# Patient Record
Sex: Female | Born: 1955
Health system: Southern US, Community
[De-identification: ages and names within clinical notes are randomized; demographics above are authoritative.]

## PROBLEM LIST (undated history)

## (undated) DIAGNOSIS — E039 Hypothyroidism, unspecified: Secondary | ICD-10-CM

## (undated) HISTORY — PX: CHOLECYSTECTOMY: SHX55

---

## 2006-01-05 ENCOUNTER — Ambulatory Visit: Payer: Self-pay

## 2006-08-09 ENCOUNTER — Ambulatory Visit: Payer: Self-pay

## 2007-01-14 ENCOUNTER — Ambulatory Visit: Payer: Self-pay

## 2007-02-05 ENCOUNTER — Ambulatory Visit: Payer: Self-pay

## 2007-03-18 IMAGING — US ULTRASOUND LEFT BREAST
1 series · 9 of 9 positions shown · non-contrast
Comparison: none

REASON FOR EXAM: LEFT breast lump
COMMENTS:

[Series 1: ultrasound left breast · 9 of 9 slices shown]
[im 1/9]
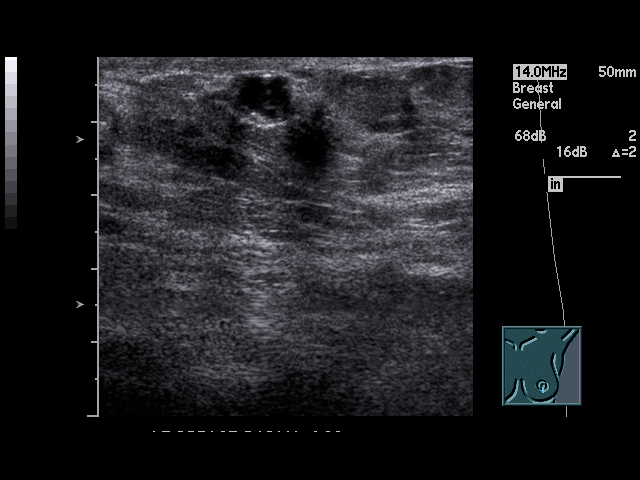
[im 2/9]
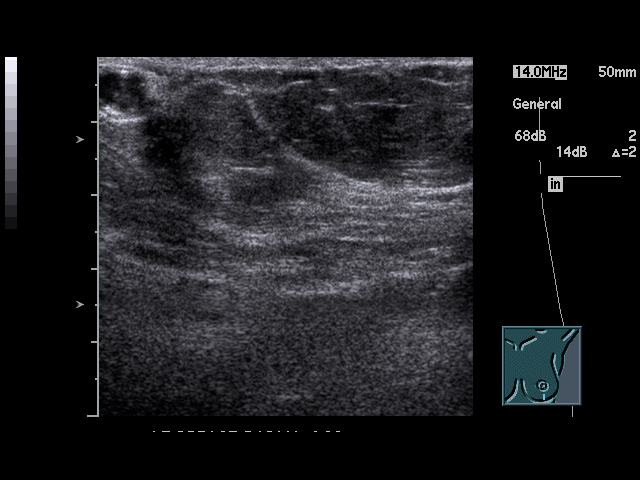
[im 3/9]
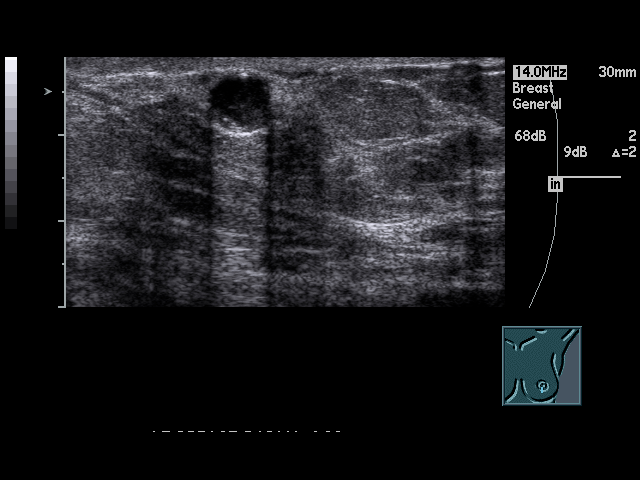
[im 4/9]
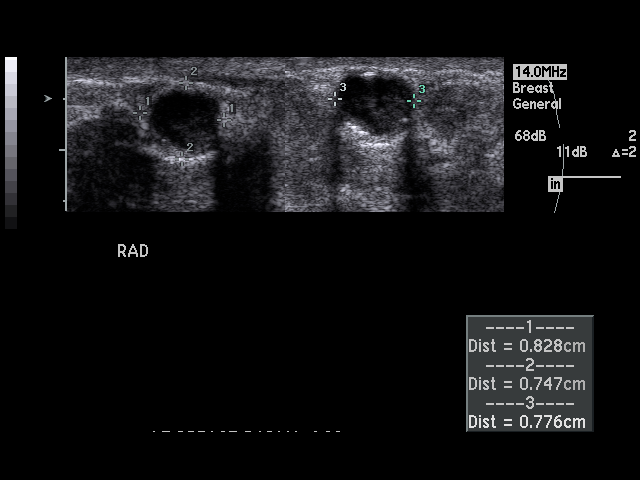
[im 5/9]
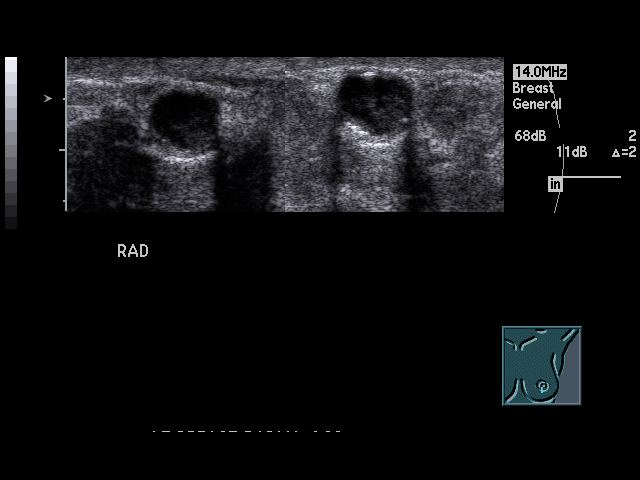
[im 6/9]
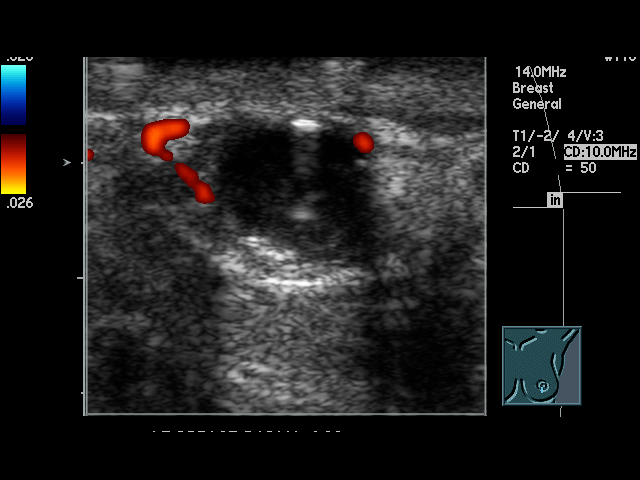
[im 7/9]
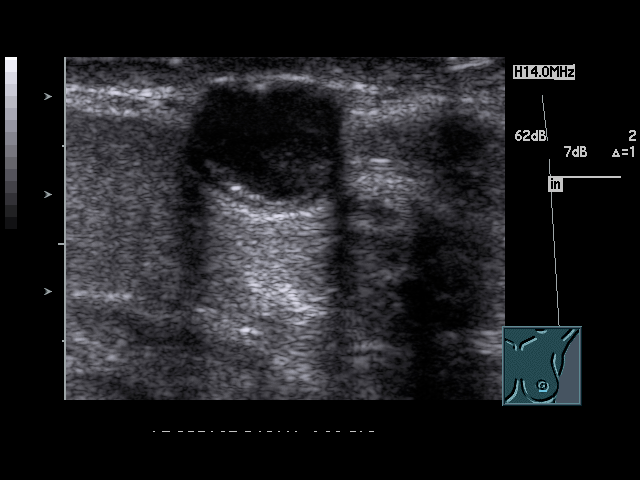
[im 8/9]
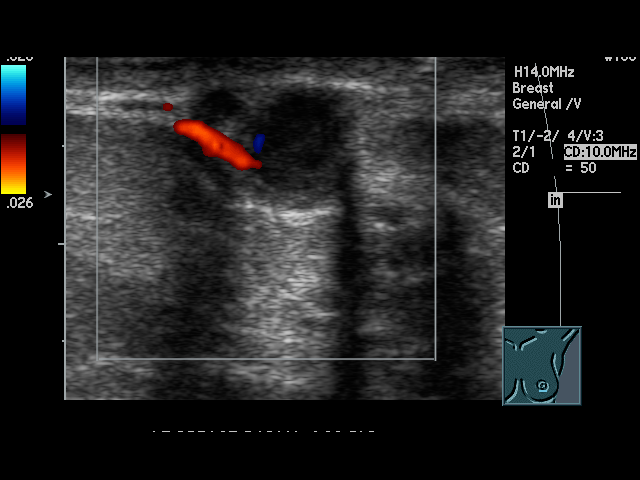
[im 9/9]
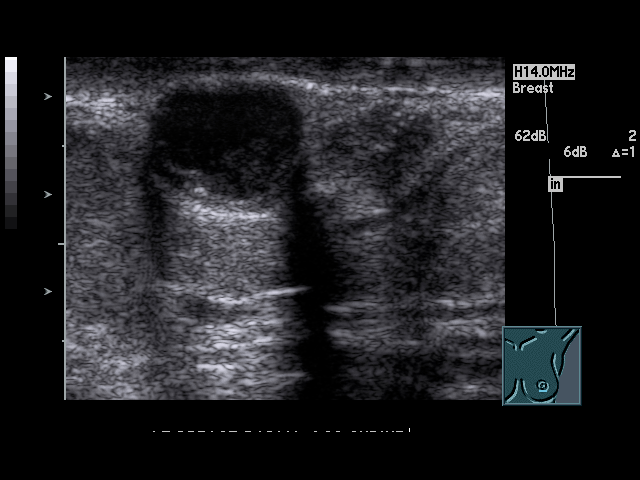

[9 of 9 positions shown; findings below may reference images not displayed]

PROCEDURE:     US  - US BREAST LEFT  - January 05, 2006 [DATE]

RESULT:     LEFT breast ultrasound targeted to the retroareolar area on the
LEFT shows a complex cystic mass adjacent to the skin and which contains
internal echoes.  In addition there appears to be some calcification
associated with the wall of this nodule.  The findings are not specific
sonographically and therefore surgical consultation for consideration of
aspiration or biopsy is recommended.  It is noted that this mass touches the
skin which would favor a benign process such as a sebaceous cyst.  This,
however, is not certain sonographically and again biopsy or aspiration is
recommended.
IMPRESSION: There is a complex cystic nodule measuring 8.28 mm in diameter in the
subareolar area on the LEFT.  The findings sonographically are indeterminate
and surgical consultation for consideration of aspiration or biopsy is
recommended.

BI-RADS:  Category 4 - Suspicious Abnormality.

## 2013-06-10 ENCOUNTER — Other Ambulatory Visit: Payer: Self-pay | Admitting: Internal Medicine

## 2013-06-10 DIAGNOSIS — R1310 Dysphagia, unspecified: Secondary | ICD-10-CM

## 2013-06-18 ENCOUNTER — Ambulatory Visit
Admission: RE | Admit: 2013-06-18 | Discharge: 2013-06-18 | Disposition: A | Discharge status: Home / Self Care | Payer: Medicaid Other | Source: Ambulatory Visit | Attending: Internal Medicine | Admitting: Internal Medicine

## 2013-06-18 ENCOUNTER — Other Ambulatory Visit: Payer: Self-pay | Admitting: Internal Medicine

## 2013-06-18 ENCOUNTER — Other Ambulatory Visit: Payer: Self-pay

## 2013-06-18 DIAGNOSIS — R1310 Dysphagia, unspecified: Secondary | ICD-10-CM

## 2013-07-22 ENCOUNTER — Ambulatory Visit: Payer: Self-pay | Admitting: Gastroenterology

## 2013-07-24 ENCOUNTER — Ambulatory Visit: Payer: Self-pay | Admitting: Physician Assistant

## 2013-07-28 LAB — PATHOLOGY REPORT

## 2013-09-08 ENCOUNTER — Ambulatory Visit: Payer: Self-pay | Admitting: Physician Assistant

## 2014-04-28 ENCOUNTER — Ambulatory Visit: Payer: Self-pay | Admitting: Physician Assistant

## 2014-10-04 IMAGING — CR RIGHT ANKLE - COMPLETE 3+ VIEW
1 series · 5 of 5 positions shown · non-contrast
Comparison: none

REASON FOR EXAM: pain
COMMENTS:

PROCEDURE:     KDR - KDXR ANKLE RIGHT COMPLETE  - July 24, 2013 [DATE]
RESULT:     Right ankle images demonstrate no fracture, dislocation or
radiopaque foreign body.

[Series 1: ap · 0.17mm/px · 5 of 5 slices shown]
[im 1/5]
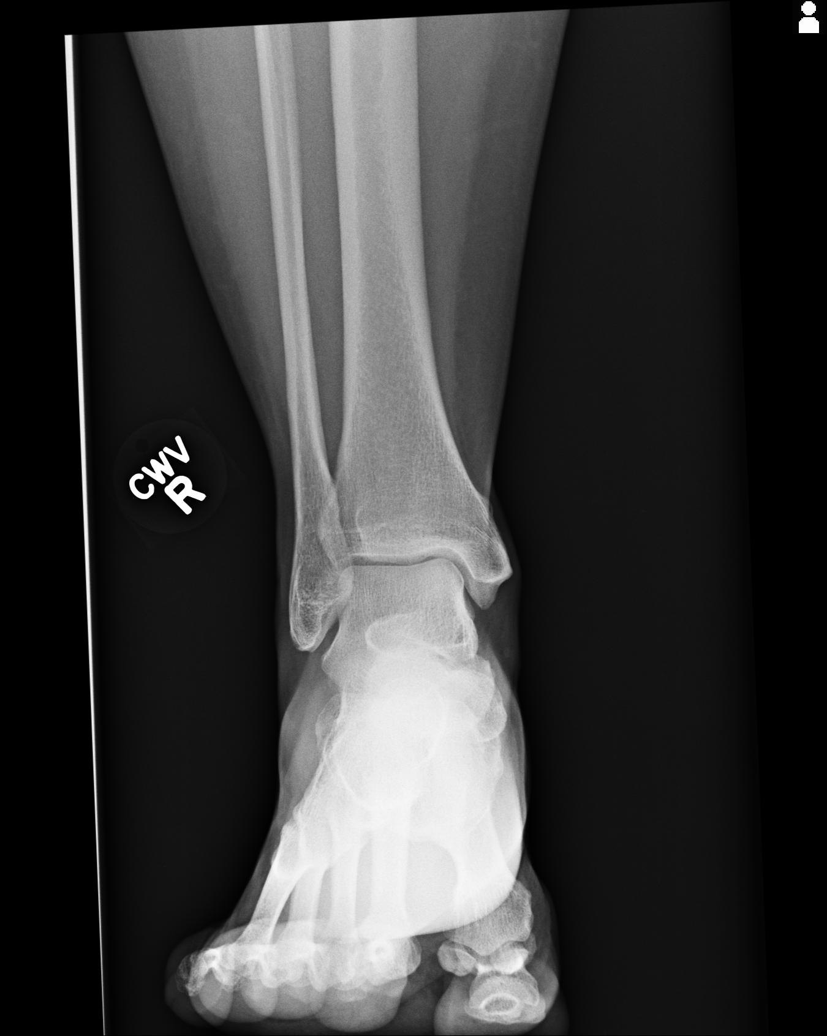
[im 2/5]
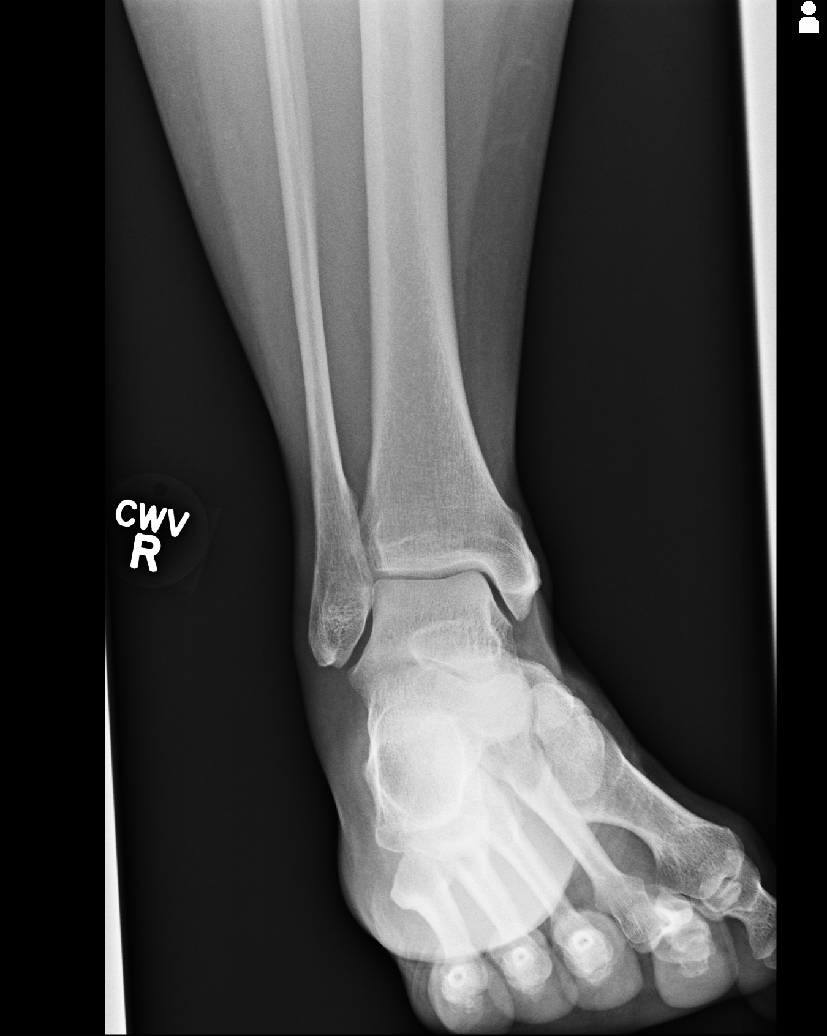
[im 3/5]
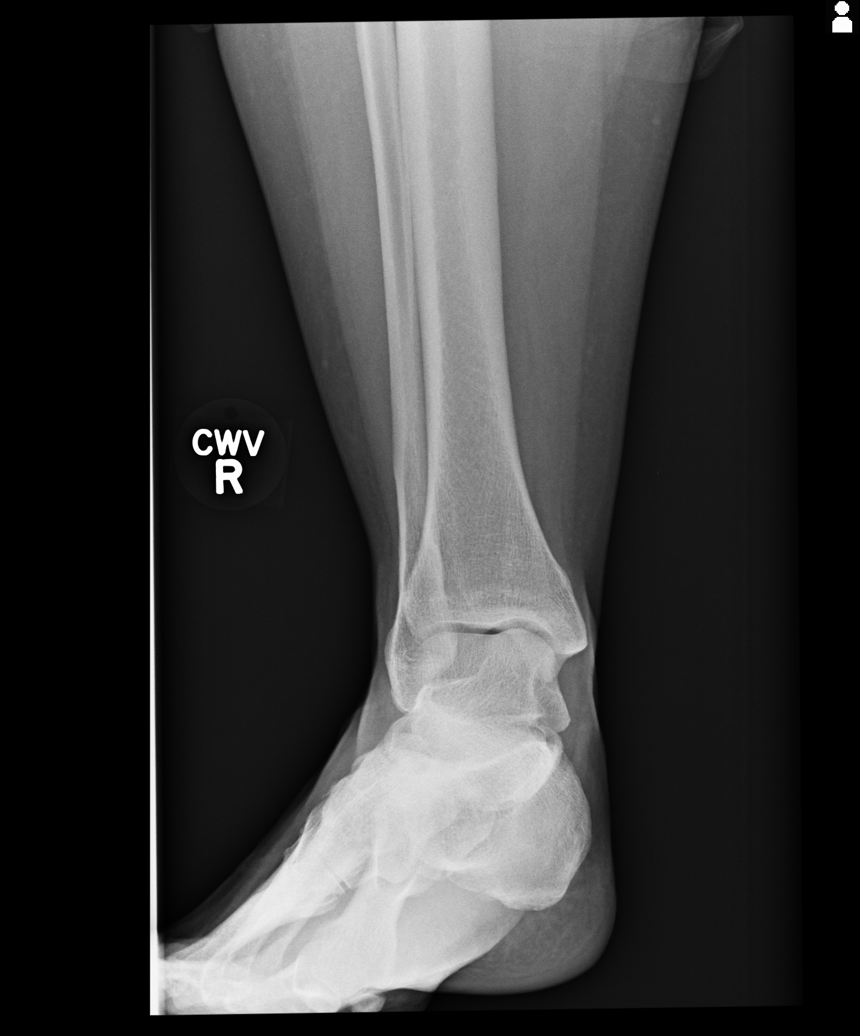
[im 4/5]
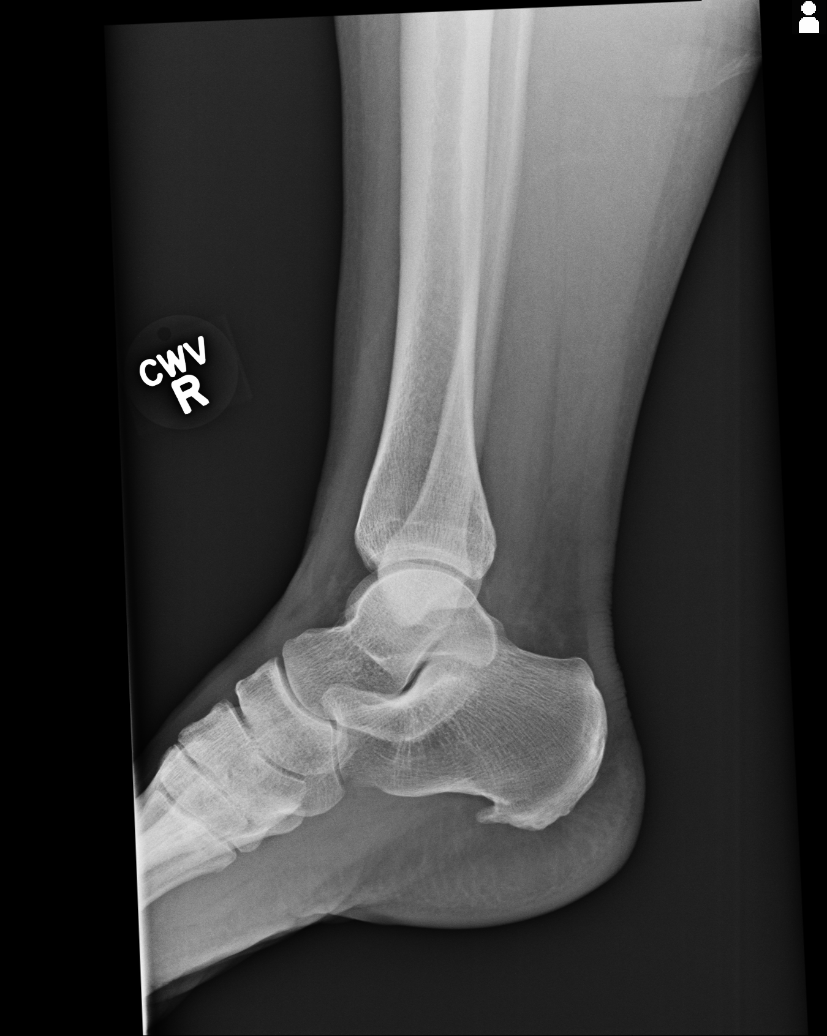
[im 5/5]
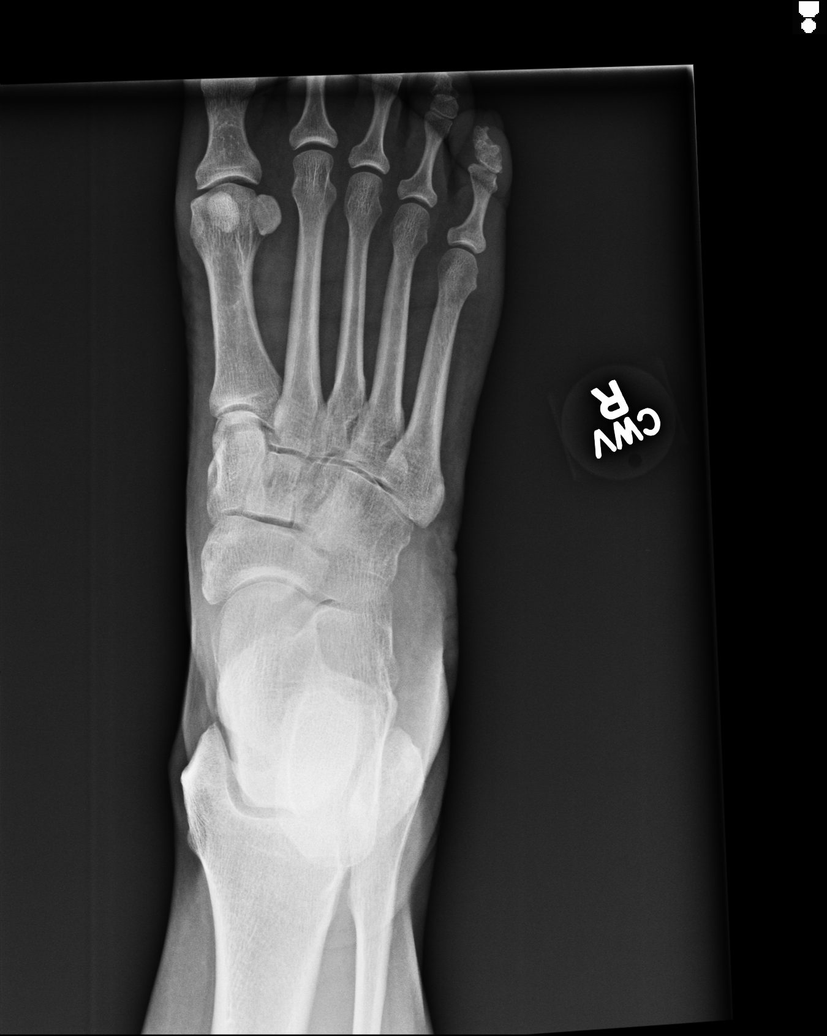

[5 of 5 positions shown; findings below may reference images not displayed]

IMPRESSION: Please see above.

[REDACTED]

## 2014-11-04 ENCOUNTER — Ambulatory Visit: Payer: Self-pay | Admitting: Physician Assistant

## 2015-01-01 ENCOUNTER — Ambulatory Visit: Payer: Self-pay | Admitting: Physician Assistant

## 2015-09-07 ENCOUNTER — Other Ambulatory Visit: Payer: Self-pay | Admitting: Physician Assistant

## 2015-09-07 DIAGNOSIS — R079 Chest pain, unspecified: Secondary | ICD-10-CM

## 2015-09-14 ENCOUNTER — Encounter
Admission: RE | Admit: 2015-09-14 | Discharge: 2015-09-14 | Disposition: A | Discharge status: Home / Self Care | Payer: Medicaid Other | Source: Ambulatory Visit | Attending: Physician Assistant | Admitting: Physician Assistant

## 2015-09-14 DIAGNOSIS — R079 Chest pain, unspecified: Secondary | ICD-10-CM | POA: Diagnosis present

## 2015-09-14 HISTORY — DX: Hypothyroidism, unspecified: E03.9

## 2015-09-14 LAB — NM MYOCAR MULTI W/SPECT W/WALL MOTION / EF
CHL CUP NUCLEAR SDS: 1
CHL CUP NUCLEAR SRS: 8
CHL CUP RESTING HR STRESS: 81 {beats}/min
CHL CUP STRESS STAGE 2 GRADE: 0 %
CHL CUP STRESS STAGE 2 HR: 85 {beats}/min
CHL CUP STRESS STAGE 3 GRADE: 0 %
CHL CUP STRESS STAGE 5 GRADE: 0 %
CHL CUP STRESS STAGE 5 HR: 91 {beats}/min
CHL CUP STRESS STAGE 6 GRADE: 0 %
CHL CUP STRESS STAGE 6 HR: 87 {beats}/min
CSEPEW: 1 METS
CSEPPHR: 96 {beats}/min
LV dias vol: 102 mL
LV sys vol: 28 mL
Percent of predicted max HR: 58 %
SSS: 5
Stage 1 HR: 81 {beats}/min
Stage 2 Speed: 0 mph
Stage 3 HR: 85 {beats}/min
Stage 3 Speed: 0 mph
Stage 4 Grade: 0 %
Stage 4 HR: 96 {beats}/min
Stage 4 Speed: 0 mph
Stage 5 DBP: 68 mmHg
Stage 5 SBP: 119 mmHg
Stage 5 Speed: 0 mph
Stage 6 DBP: 68 mmHg
Stage 6 SBP: 119 mmHg
Stage 6 Speed: 0 mph
TID: 0.87

## 2015-09-14 MED ORDER — TECHNETIUM TC 99M SESTAMIBI - CARDIOLITE
13.0000 | Freq: Once | INTRAVENOUS | Status: AC | PRN
  Administered 2015-09-14: 12.51 via INTRAVENOUS

## 2015-09-14 MED ORDER — REGADENOSON 0.4 MG/5ML IV SOLN
0.4000 mg | Freq: Once | INTRAVENOUS | Status: AC
  Administered 2015-09-14: 0.4 mg via INTRAVENOUS
  Filled 2015-09-14: qty 5

## 2015-09-14 MED ORDER — TECHNETIUM TC 99M SESTAMIBI - CARDIOLITE
30.0000 | Freq: Once | INTRAVENOUS | Status: AC | PRN
  Administered 2015-09-14: 11:00:00 32.91 via INTRAVENOUS

## 2017-06-25 DIAGNOSIS — G4733 Obstructive sleep apnea (adult) (pediatric): Secondary | ICD-10-CM | POA: Diagnosis not present

## 2017-06-25 DIAGNOSIS — B029 Zoster without complications: Secondary | ICD-10-CM | POA: Diagnosis not present

## 2017-06-27 DIAGNOSIS — E039 Hypothyroidism, unspecified: Secondary | ICD-10-CM | POA: Diagnosis not present

## 2017-06-27 DIAGNOSIS — E119 Type 2 diabetes mellitus without complications: Secondary | ICD-10-CM | POA: Diagnosis not present

## 2017-06-27 DIAGNOSIS — M5441 Lumbago with sciatica, right side: Secondary | ICD-10-CM | POA: Diagnosis not present

## 2017-06-27 DIAGNOSIS — B029 Zoster without complications: Secondary | ICD-10-CM | POA: Diagnosis not present

## 2017-07-12 DIAGNOSIS — M5416 Radiculopathy, lumbar region: Secondary | ICD-10-CM | POA: Diagnosis not present

## 2017-07-12 DIAGNOSIS — M47812 Spondylosis without myelopathy or radiculopathy, cervical region: Secondary | ICD-10-CM | POA: Diagnosis not present

## 2017-07-12 DIAGNOSIS — M542 Cervicalgia: Secondary | ICD-10-CM | POA: Diagnosis not present

## 2017-07-12 DIAGNOSIS — M5136 Other intervertebral disc degeneration, lumbar region: Secondary | ICD-10-CM | POA: Diagnosis not present

## 2017-08-17 DIAGNOSIS — D649 Anemia, unspecified: Secondary | ICD-10-CM | POA: Diagnosis not present

## 2017-08-17 DIAGNOSIS — E6609 Other obesity due to excess calories: Secondary | ICD-10-CM | POA: Diagnosis not present

## 2017-08-17 DIAGNOSIS — E119 Type 2 diabetes mellitus without complications: Secondary | ICD-10-CM | POA: Diagnosis not present

## 2017-08-17 DIAGNOSIS — E039 Hypothyroidism, unspecified: Secondary | ICD-10-CM | POA: Diagnosis not present

## 2017-08-17 DIAGNOSIS — Z6834 Body mass index (BMI) 34.0-34.9, adult: Secondary | ICD-10-CM | POA: Diagnosis not present

## 2017-08-22 ENCOUNTER — Other Ambulatory Visit
Admission: RE | Admit: 2017-08-22 | Discharge: 2017-08-22 | Disposition: A | Discharge status: Home / Self Care | Payer: BLUE CROSS/BLUE SHIELD | Source: Ambulatory Visit | Attending: Nurse Practitioner | Admitting: Nurse Practitioner

## 2017-08-22 DIAGNOSIS — Z01812 Encounter for preprocedural laboratory examination: Secondary | ICD-10-CM | POA: Diagnosis not present

## 2017-08-22 DIAGNOSIS — E119 Type 2 diabetes mellitus without complications: Secondary | ICD-10-CM | POA: Insufficient documentation

## 2017-08-22 DIAGNOSIS — E039 Hypothyroidism, unspecified: Secondary | ICD-10-CM | POA: Insufficient documentation

## 2017-08-22 DIAGNOSIS — R5383 Other fatigue: Secondary | ICD-10-CM | POA: Insufficient documentation

## 2017-08-22 DIAGNOSIS — D649 Anemia, unspecified: Secondary | ICD-10-CM | POA: Diagnosis not present

## 2017-08-22 LAB — CBC
HCT: 39.6 % (ref 35.0–47.0)
Hemoglobin: 13.6 g/dL (ref 12.0–16.0)
MCH: 30.9 pg (ref 26.0–34.0)
MCHC: 34.5 g/dL (ref 32.0–36.0)
MCV: 89.8 fL (ref 80.0–100.0)
PLATELETS: 197 10*3/uL (ref 150–440)
RBC: 4.41 MIL/uL (ref 3.80–5.20)
RDW: 12.5 % (ref 11.5–14.5)
WBC: 4.2 10*3/uL (ref 3.6–11.0)

## 2017-08-22 LAB — COMPREHENSIVE METABOLIC PANEL
ALBUMIN: 4 g/dL (ref 3.5–5.0)
ALK PHOS: 104 U/L (ref 38–126)
ALT: 24 U/L (ref 14–54)
ANION GAP: 8 (ref 5–15)
AST: 29 U/L (ref 15–41)
BUN: 16 mg/dL (ref 6–20)
CALCIUM: 9.4 mg/dL (ref 8.9–10.3)
CHLORIDE: 105 mmol/L (ref 101–111)
CO2: 28 mmol/L (ref 22–32)
Creatinine, Ser: 1.16 mg/dL — ABNORMAL HIGH (ref 0.44–1.00)
GFR calc non Af Amer: 50 mL/min — ABNORMAL LOW (ref >60)
GFR, EST AFRICAN AMERICAN: 59 mL/min — AB (ref >60)
Glucose, Bld: 100 mg/dL — ABNORMAL HIGH (ref 65–99)
Potassium: 4.3 mmol/L (ref 3.5–5.1)
SODIUM: 141 mmol/L (ref 135–145)
Total Bilirubin: 0.8 mg/dL (ref 0.3–1.2)
Total Protein: 7.4 g/dL (ref 6.5–8.1)

## 2017-08-22 LAB — FERRITIN: Ferritin: 192 ng/mL (ref 11–307)

## 2017-08-22 LAB — VITAMIN B12: Vitamin B-12: 408 pg/mL (ref 180–914)

## 2017-08-22 LAB — LIPID PANEL
CHOL/HDL RATIO: 3.3 ratio
CHOLESTEROL: 153 mg/dL (ref 0–200)
HDL: 46 mg/dL (ref >40)
LDL Cholesterol: 65 mg/dL (ref 0–99)
Triglycerides: 209 mg/dL — ABNORMAL HIGH (ref <150)
VLDL: 42 mg/dL — ABNORMAL HIGH (ref 0–40)

## 2017-08-22 LAB — FOLATE: FOLATE: 12.1 ng/mL (ref >5.9)

## 2017-08-22 LAB — TSH: TSH: 1.836 u[IU]/mL (ref 0.350–4.500)

## 2017-08-22 LAB — T4, FREE: FREE T4: 1.12 ng/dL (ref 0.61–1.12)

## 2017-08-23 LAB — MICROALBUMIN, URINE: Microalb, Ur: 19.6 ug/mL — ABNORMAL HIGH

## 2017-09-19 DIAGNOSIS — R944 Abnormal results of kidney function studies: Secondary | ICD-10-CM | POA: Diagnosis not present

## 2017-09-19 DIAGNOSIS — Z6834 Body mass index (BMI) 34.0-34.9, adult: Secondary | ICD-10-CM | POA: Diagnosis not present

## 2017-09-19 DIAGNOSIS — E119 Type 2 diabetes mellitus without complications: Secondary | ICD-10-CM | POA: Diagnosis not present

## 2017-10-26 ENCOUNTER — Ambulatory Visit: Payer: Self-pay | Admitting: Nurse Practitioner

## 2017-10-26 ENCOUNTER — Other Ambulatory Visit: Payer: Self-pay | Admitting: Internal Medicine

## 2017-10-26 DIAGNOSIS — E039 Hypothyroidism, unspecified: Secondary | ICD-10-CM | POA: Insufficient documentation

## 2017-10-26 DIAGNOSIS — M5441 Lumbago with sciatica, right side: Secondary | ICD-10-CM | POA: Insufficient documentation

## 2017-10-26 DIAGNOSIS — R21 Rash and other nonspecific skin eruption: Secondary | ICD-10-CM | POA: Insufficient documentation

## 2017-10-26 DIAGNOSIS — R42 Dizziness and giddiness: Secondary | ICD-10-CM | POA: Insufficient documentation

## 2017-10-26 DIAGNOSIS — K219 Gastro-esophageal reflux disease without esophagitis: Secondary | ICD-10-CM | POA: Insufficient documentation

## 2017-10-26 DIAGNOSIS — N950 Postmenopausal bleeding: Secondary | ICD-10-CM | POA: Insufficient documentation

## 2017-10-26 DIAGNOSIS — R0602 Shortness of breath: Secondary | ICD-10-CM | POA: Insufficient documentation

## 2017-10-26 DIAGNOSIS — L821 Other seborrheic keratosis: Secondary | ICD-10-CM | POA: Insufficient documentation

## 2017-10-26 DIAGNOSIS — K760 Fatty (change of) liver, not elsewhere classified: Secondary | ICD-10-CM | POA: Insufficient documentation

## 2017-10-26 DIAGNOSIS — R7301 Impaired fasting glucose: Secondary | ICD-10-CM | POA: Insufficient documentation

## 2017-10-26 DIAGNOSIS — G4733 Obstructive sleep apnea (adult) (pediatric): Secondary | ICD-10-CM | POA: Insufficient documentation

## 2017-10-26 DIAGNOSIS — R1032 Left lower quadrant pain: Secondary | ICD-10-CM | POA: Insufficient documentation

## 2017-10-26 DIAGNOSIS — K120 Recurrent oral aphthae: Secondary | ICD-10-CM | POA: Insufficient documentation

## 2017-10-26 DIAGNOSIS — E8881 Metabolic syndrome: Secondary | ICD-10-CM | POA: Insufficient documentation

## 2017-10-26 DIAGNOSIS — B029 Zoster without complications: Secondary | ICD-10-CM | POA: Insufficient documentation

## 2017-10-26 DIAGNOSIS — G4730 Sleep apnea, unspecified: Secondary | ICD-10-CM | POA: Insufficient documentation

## 2017-10-26 DIAGNOSIS — R51 Headache: Secondary | ICD-10-CM

## 2017-10-26 DIAGNOSIS — M130 Polyarthritis, unspecified: Secondary | ICD-10-CM | POA: Insufficient documentation

## 2017-10-26 DIAGNOSIS — M25571 Pain in right ankle and joints of right foot: Secondary | ICD-10-CM | POA: Insufficient documentation

## 2017-10-26 DIAGNOSIS — E559 Vitamin D deficiency, unspecified: Secondary | ICD-10-CM | POA: Insufficient documentation

## 2017-10-26 DIAGNOSIS — B372 Candidiasis of skin and nail: Secondary | ICD-10-CM | POA: Insufficient documentation

## 2017-10-26 DIAGNOSIS — R300 Dysuria: Secondary | ICD-10-CM | POA: Insufficient documentation

## 2017-10-26 DIAGNOSIS — R740 Nonspecific elevation of levels of transaminase and lactic acid dehydrogenase [LDH]: Secondary | ICD-10-CM

## 2017-10-26 DIAGNOSIS — R944 Abnormal results of kidney function studies: Secondary | ICD-10-CM | POA: Insufficient documentation

## 2017-10-26 DIAGNOSIS — F411 Generalized anxiety disorder: Secondary | ICD-10-CM | POA: Insufficient documentation

## 2017-10-26 DIAGNOSIS — F331 Major depressive disorder, recurrent, moderate: Secondary | ICD-10-CM | POA: Insufficient documentation

## 2017-10-26 DIAGNOSIS — R7402 Elevation of levels of lactic acid dehydrogenase (LDH): Secondary | ICD-10-CM | POA: Insufficient documentation

## 2017-10-26 DIAGNOSIS — R519 Headache, unspecified: Secondary | ICD-10-CM | POA: Insufficient documentation

## 2017-10-26 DIAGNOSIS — R079 Chest pain, unspecified: Secondary | ICD-10-CM | POA: Insufficient documentation

## 2017-10-26 DIAGNOSIS — E119 Type 2 diabetes mellitus without complications: Secondary | ICD-10-CM | POA: Insufficient documentation

## 2017-10-26 DIAGNOSIS — R5383 Other fatigue: Secondary | ICD-10-CM | POA: Insufficient documentation

## 2017-10-26 DIAGNOSIS — D519 Vitamin B12 deficiency anemia, unspecified: Secondary | ICD-10-CM | POA: Insufficient documentation

## 2017-10-26 DIAGNOSIS — E785 Hyperlipidemia, unspecified: Secondary | ICD-10-CM | POA: Insufficient documentation

## 2017-10-26 DIAGNOSIS — L409 Psoriasis, unspecified: Secondary | ICD-10-CM | POA: Insufficient documentation

## 2017-10-26 DIAGNOSIS — R749 Abnormal serum enzyme level, unspecified: Secondary | ICD-10-CM | POA: Insufficient documentation

## 2017-10-26 DIAGNOSIS — N8500 Endometrial hyperplasia, unspecified: Secondary | ICD-10-CM | POA: Insufficient documentation

## 2017-10-26 DIAGNOSIS — J301 Allergic rhinitis due to pollen: Secondary | ICD-10-CM | POA: Insufficient documentation

## 2017-10-26 DIAGNOSIS — E669 Obesity, unspecified: Secondary | ICD-10-CM | POA: Insufficient documentation

## 2017-11-19 ENCOUNTER — Other Ambulatory Visit: Payer: Self-pay | Admitting: Nurse Practitioner

## 2017-11-19 DIAGNOSIS — R42 Dizziness and giddiness: Secondary | ICD-10-CM

## 2017-11-19 MED ORDER — MECLIZINE HCL 25 MG PO TABS: 25.0000 mg | ORAL_TABLET | Freq: Three times a day (TID) | ORAL | 3 refills | Status: DC | PRN

## 2017-11-19 NOTE — Progress Notes (Signed)
rx for meclizine 25mg  TID prn vertigo sent to [facility] [address].

## 2017-11-28 ENCOUNTER — Other Ambulatory Visit: Payer: Self-pay

## 2017-11-28 MED ORDER — MELOXICAM 7.5 MG PO TABS: 7.5000 mg | ORAL_TABLET | Freq: Two times a day (BID) | ORAL | 1 refills | Status: DC | PRN

## 2017-12-03 ENCOUNTER — Other Ambulatory Visit: Payer: Self-pay

## 2017-12-03 MED ORDER — OMEPRAZOLE 20 MG PO CPDR: 20.0000 mg | DELAYED_RELEASE_CAPSULE | Freq: Every day | ORAL | 5 refills | Status: DC

## 2017-12-11 ENCOUNTER — Ambulatory Visit: Payer: Self-pay | Admitting: Nurse Practitioner

## 2017-12-11 ENCOUNTER — Encounter: Payer: Self-pay | Admitting: Nurse Practitioner

## 2017-12-11 VITALS — BP 140/80 | HR 70 | Resp 16 | Ht 67.0 in | Wt 235.6 lb

## 2017-12-11 DIAGNOSIS — M25562 Pain in left knee: Secondary | ICD-10-CM

## 2017-12-11 DIAGNOSIS — R42 Dizziness and giddiness: Secondary | ICD-10-CM

## 2017-12-11 DIAGNOSIS — E1165 Type 2 diabetes mellitus with hyperglycemia: Secondary | ICD-10-CM

## 2017-12-11 DIAGNOSIS — E039 Hypothyroidism, unspecified: Secondary | ICD-10-CM | POA: Diagnosis not present

## 2017-12-11 DIAGNOSIS — M67432 Ganglion, left wrist: Secondary | ICD-10-CM

## 2017-12-11 DIAGNOSIS — K219 Gastro-esophageal reflux disease without esophagitis: Secondary | ICD-10-CM

## 2017-12-11 DIAGNOSIS — M545 Low back pain, unspecified: Secondary | ICD-10-CM

## 2017-12-11 MED ORDER — CYCLOBENZAPRINE HCL 10 MG PO TABS: 10.0000 mg | ORAL_TABLET | Freq: Two times a day (BID) | ORAL | 2 refills | Status: DC | PRN

## 2017-12-11 MED ORDER — HYDROCODONE-ACETAMINOPHEN 5-325 MG PO TABS: 1.0000 | ORAL_TABLET | Freq: Four times a day (QID) | ORAL | 0 refills | Status: DC | PRN

## 2017-12-11 MED ORDER — PANTOPRAZOLE SODIUM 40 MG PO TBEC: 40.0000 mg | DELAYED_RELEASE_TABLET | Freq: Every day | ORAL | 3 refills | Status: DC

## 2017-12-11 MED ORDER — LEVOTHYROXINE SODIUM 112 MCG PO TABS: 112.0000 ug | ORAL_TABLET | Freq: Every day | ORAL | 3 refills | Status: DC

## 2017-12-11 NOTE — Progress Notes (Signed)
[facility] [address] [place], [place] [address]  Internal MEDICINE  Office Visit Note  Patient Name: Jo Watkins  [id number]  [id number]  Date of Service: 12/19/2017  Chief Complaint  Patient presents with  . Dizziness  . Knee Pain    left   . Wrist Problem    ganglion cyst left wrist    The patient is here for routine follow up exam 12/19/17, she is also concerned about palpable know at base of her left thumb. This is getting a bit larger and is tender to palpate. Hurts even more if she bumps the area on anything. ROM of her thumb and hand are intact.    Dizziness  This is a recurrent problem. The current episode started more than 1 month ago. The problem occurs 2 to 4 times per day. The problem has been gradually worsening. Associated symptoms include arthralgias, congestion, fatigue, headaches, myalgias and nausea. Pertinent negatives include no chest pain, coughing, rash or vomiting. The symptoms are aggravated by standing (lying down). She has tried lying down and position changes (antivert. has seen ENT for this in the past which has helped) for the symptoms. The treatment provided moderate relief.  Knee Pain   The incident occurred more than 1 week ago. There was no injury mechanism. The pain is present in the left knee. The pain is moderate. The pain has been worsening since onset. Associated symptoms include a loss of motion and muscle weakness. She reports no foreign bodies present. The symptoms are aggravated by movement and weight bearing. She has tried rest, ice, non-weight bearing, NSAIDs, elevation and acetaminophen for the symptoms. The treatment provided mild relief.    Pt is here for routine follow up.    Current Medication: Outpatient Encounter Medications as of 12/11/2017  Medication Sig  . meclizine (ANTIVERT) 25 MG tablet Take 1 tablet (25 mg total) by mouth 3 (three) times daily as needed for dizziness.  . meloxicam (MOBIC) 7.5 MG tablet  Take 1 tablet (7.5 mg total) by mouth 2 (two) times daily as needed for pain.  . Cetirizine HCl (ZYRTEC ALLERGY) 10 MG CAPS Take by mouth.  . cyclobenzaprine (FLEXERIL) 10 MG tablet Take 1 tablet (10 mg total) by mouth 2 (two) times daily as needed for muscle spasms.  Marland Kitchen. gabapentin (NEURONTIN) 100 MG capsule   . HYDROcodone-acetaminophen (NORCO/VICODIN) 5-325 MG tablet Take 1 tablet by mouth every 6 (six) hours as needed for moderate pain.  Marland Kitchen. levothyroxine (SYNTHROID, LEVOTHROID) 112 MCG tablet Take 1 tablet (112 mcg total) by mouth daily before breakfast.  . metFORMIN (GLUCOPHAGE) 500 MG tablet   . mometasone (NASONEX) 50 MCG/ACT nasal spray Place into the nose.  . pantoprazole (PROTONIX) 40 MG tablet Take 1 tablet (40 mg total) by mouth daily.  . Phendimetrazine Tartrate 105 MG CP24 Take by mouth.  . [DISCONTINUED] cyclobenzaprine (FLEXERIL) 10 MG tablet   . [DISCONTINUED] levothyroxine (SYNTHROID, LEVOTHROID) 112 MCG tablet   . [DISCONTINUED] omeprazole (PRILOSEC) 20 MG capsule Take 1 capsule (20 mg total) by mouth daily.   No facility-administered encounter medications on file as of 12/11/2017.     Surgical History: Past Surgical History:  Procedure Laterality Date  . CHOLECYSTECTOMY      Medical History: Past Medical History:  Diagnosis Date  . Hypothyroidism     Family History: No family history on file.  Social History   Socioeconomic History  . Marital status: Married    Spouse name: Not on file  .  Number of children: Not on file  . Years of education: Not on file  . Highest education level: Not on file  Social Needs  . Financial resource strain: Not on file  . Food insecurity - worry: Not on file  . Food insecurity - inability: Not on file  . Transportation needs - medical: Not on file  . Transportation needs - non-medical: Not on file  Occupational History  . Not on file  Tobacco Use  . Smoking status: Former Games developer  . Smokeless tobacco: Never Used    Substance and Sexual Activity  . Alcohol use: No    Frequency: Never  . Drug use: No  . Sexual activity: Not on file  Other Topics Concern  . Not on file  Social History Narrative  . Not on file      Review of Systems  Constitutional: Positive for fatigue.  HENT: Positive for congestion, postnasal drip, rhinorrhea and sinus pressure.   Eyes: Negative.   Respiratory: Negative for cough and wheezing.   Cardiovascular: Negative for chest pain and palpitations.  Gastrointestinal: Positive for nausea. Negative for vomiting.  Endocrine:       Blood sugars doing well   Musculoskeletal: Positive for arthralgias and myalgias.       The patient having moderate left knee pain. Also has cyst like lesion at base of her thumb which is getting larger and more tender.   Skin: Negative for rash.  Allergic/Immunologic: Positive for environmental allergies.  Neurological: Positive for dizziness and headaches.  Hematological: Negative for adenopathy. Does not bruise/bleed easily.  Psychiatric/Behavioral: The patient is not nervous/anxious.    Today's Vitals   12/11/17 1525  BP: 140/80  Pulse: 70  Resp: 16  SpO2: 95%  Weight: 235 lb 9.6 oz (106.9 kg)  Height: 5\' 7"  (1.702 m)    Physical Exam  Constitutional: She is oriented to person, place, and time. She appears well-developed and well-nourished. No distress.  HENT:  Head: Normocephalic and atraumatic.  Mouth/Throat: Oropharynx is clear and moist. No oropharyngeal exudate.  Eyes: EOM are normal. Pupils are equal, round, and reactive to light.  Neck: Normal range of motion. Neck supple. No JVD present. No tracheal deviation present. No thyromegaly present.  Cardiovascular: Normal rate, regular rhythm and normal heart sounds. Exam reveals no gallop and no friction rub.  No murmur heard. Pulmonary/Chest: Effort normal and breath sounds normal. No respiratory distress. She has no wheezes. She has no rales. She exhibits no tenderness.   Abdominal: Soft. Bowel sounds are normal. There is no tenderness.  Musculoskeletal: Normal range of motion.       Left wrist: She exhibits bony tenderness, swelling and deformity.       Arms:      Legs: Lymphadenopathy:    She has no cervical adenopathy.  Neurological: She is alert and oriented to person, place, and time. No cranial nerve deficit.  Skin: Skin is warm and dry. She is not diaphoretic.  Psychiatric: She has a normal mood and affect. Her behavior is normal. Judgment and thought content normal.  Nursing note and vitals reviewed.  Assessment/Plan: 1. Uncontrolled type 2 diabetes mellitus with hyperglycemia (HCC) - POCT HgB A1C 5.4 today. Continue diabetic medication as prescribed.   2. Vertigo Refer to ENT provider she has seen in the past for similar issue. In meantime, continue taking Meclizine as needed and as prescribed.  - Ambulatory referral to ENT  3. Acute pain of left knee - DG Knee Complete 4 Views  Left; Future - HYDROcodone-acetaminophen (NORCO/VICODIN) 5-325 MG tablet; Take 1 tablet by mouth every 6 (six) hours as needed for moderate pain.  Dispense: 45 tablet; Refill: 0 Advised her to rest, ice, elevate the knee. Recommend use of ace bondage to provide support to knee.   4. Ganglion cyst of dorsum of left wrist - US SOFT TISSUE UPPER EXTREMITY LIMITED LEFT (NON-VASCULAR); Future - HYDROcodone-acetaminophen (NORCO/VICODIN) 5-325 MG tablet; Take 1 tablet by mouth every 6 (six) hours as needed for moderate pain.  Dispense: 45 tablet; Refill: 0  5. Gastroesophageal reflux disease without esophagitis - pantoprazole (PROTONIX) 40 MG tablet; Take 1 tablet (40 mg total) by mouth daily.  Dispense: 30 tablet; Refill: 3  6. Acquired hypothyroidism - levothyroxine (SYNTHROID, LEVOTHROID) 112 MCG tablet; Take 1 tablet (112 mcg total) by mouth daily before breakfast.  Dispense: 30 tablet; Refill: 3  7. Low back pain at multiple sites - cyclobenzaprine (FLEXERIL) 10 MG  tablet; Take 1 tablet (10 mg total) by mouth 2 (two) times daily as needed for muscle spasms.  Dispense: 60 tablet; Refill: 2   General Counseling: eufelia veno understanding of the findings of todays visit and agrees with plan of treatment. I have discussed any further diagnostic evaluation that may be needed or ordered today. We also reviewed her medications today. she has been encouraged to call the office with any questions or concerns that should arise related to todays visit.   Reviewed risks and possible side effects associated with taking opiates and benzodiazepines. Combination of these could cause dizziness and drowsiness. Advised him not to drive or operate machinery when taking these medications, as he could put his life and the lives of others at risk. He voiced understanding.   This patient was seen by Vincent Gros, FNP- C in Collaboration with Dr Lyndon Code as a part of collaborative care agreement    Orders Placed This Encounter  Procedures  . US SOFT TISSUE UPPER EXTREMITY LIMITED LEFT (NON-VASCULAR)  . DG Knee Complete 4 Views Left  . Ambulatory referral to ENT  . POCT HgB A1C    Meds ordered this encounter  Medications  . HYDROcodone-acetaminophen (NORCO/VICODIN) 5-325 MG tablet    Sig: Take 1 tablet by mouth every 6 (six) hours as needed for moderate pain.    Dispense:  45 tablet    Refill:  0    Order Specific Question:   Supervising Provider    Answer:   Lyndon Code [[id number]]  . cyclobenzaprine (FLEXERIL) 10 MG tablet    Sig: Take 1 tablet (10 mg total) by mouth 2 (two) times daily as needed for muscle spasms.    Dispense:  60 tablet    Refill:  2    Order Specific Question:   Supervising Provider    Answer:   Lyndon Code [[id number]]  . levothyroxine (SYNTHROID, LEVOTHROID) 112 MCG tablet    Sig: Take 1 tablet (112 mcg total) by mouth daily before breakfast.    Dispense:  30 tablet    Refill:  3    Order Specific Question:   Supervising Provider     Answer:   Lyndon Code [[id number]]  . pantoprazole (PROTONIX) 40 MG tablet    Sig: Take 1 tablet (40 mg total) by mouth daily.    Dispense:  30 tablet    Refill:  3    Order Specific Question:   Supervising Provider    Answer:   Lyndon Code [[id number]]    Time  spent: 23 Minutes   Dr Lyndon Code Internal medicine

## 2017-12-17 ENCOUNTER — Ambulatory Visit
Admission: RE | Admit: 2017-12-17 | Discharge: 2017-12-17 | Disposition: A | Discharge status: Home / Self Care | Payer: BLUE CROSS/BLUE SHIELD | Source: Ambulatory Visit | Attending: Nurse Practitioner | Admitting: Nurse Practitioner

## 2017-12-17 DIAGNOSIS — M179 Osteoarthritis of knee, unspecified: Secondary | ICD-10-CM | POA: Diagnosis not present

## 2017-12-17 DIAGNOSIS — M1712 Unilateral primary osteoarthritis, left knee: Secondary | ICD-10-CM | POA: Insufficient documentation

## 2017-12-17 DIAGNOSIS — M25562 Pain in left knee: Secondary | ICD-10-CM | POA: Insufficient documentation

## 2017-12-19 ENCOUNTER — Telehealth: Payer: Self-pay

## 2017-12-19 ENCOUNTER — Encounter: Payer: Self-pay | Admitting: Nurse Practitioner

## 2017-12-19 DIAGNOSIS — M25562 Pain in left knee: Secondary | ICD-10-CM | POA: Insufficient documentation

## 2017-12-19 DIAGNOSIS — M545 Low back pain, unspecified: Secondary | ICD-10-CM | POA: Insufficient documentation

## 2017-12-19 DIAGNOSIS — E1165 Type 2 diabetes mellitus with hyperglycemia: Secondary | ICD-10-CM | POA: Insufficient documentation

## 2017-12-19 DIAGNOSIS — R42 Dizziness and giddiness: Secondary | ICD-10-CM | POA: Insufficient documentation

## 2017-12-19 DIAGNOSIS — M67432 Ganglion, left wrist: Secondary | ICD-10-CM | POA: Insufficient documentation

## 2017-12-19 LAB — POCT GLYCOSYLATED HEMOGLOBIN (HGB A1C): Hemoglobin A1C: 5.4

## 2017-12-19 NOTE — Telephone Encounter (Signed)
t vm about xray and that message sent to beth to get referral set up for ortho. dbs

## 2017-12-19 NOTE — Telephone Encounter (Signed)
-----   Message from Carlean JewsHeather E Boscia, NP sent at 12/18/2017  8:51 AM EST ----- Please let the patient know that x-ray of her knee shows significant degenerative changes. I would like to go ahead and have her referred to orthopedics. thanks

## 2017-12-24 ENCOUNTER — Ambulatory Visit (INDEPENDENT_AMBULATORY_CARE_PROVIDER_SITE_OTHER): Payer: BLUE CROSS/BLUE SHIELD

## 2017-12-24 DIAGNOSIS — M67432 Ganglion, left wrist: Secondary | ICD-10-CM

## 2017-12-27 ENCOUNTER — Ambulatory Visit: Payer: Self-pay | Admitting: Internal Medicine

## 2017-12-28 DIAGNOSIS — M179 Osteoarthritis of knee, unspecified: Secondary | ICD-10-CM | POA: Insufficient documentation

## 2017-12-28 DIAGNOSIS — M1712 Unilateral primary osteoarthritis, left knee: Secondary | ICD-10-CM | POA: Diagnosis not present

## 2017-12-28 DIAGNOSIS — M1710 Unilateral primary osteoarthritis, unspecified knee: Secondary | ICD-10-CM | POA: Insufficient documentation

## 2017-12-30 ENCOUNTER — Other Ambulatory Visit: Payer: Self-pay | Admitting: Internal Medicine

## 2017-12-30 DIAGNOSIS — E039 Hypothyroidism, unspecified: Secondary | ICD-10-CM

## 2018-01-21 DIAGNOSIS — H8111 Benign paroxysmal vertigo, right ear: Secondary | ICD-10-CM | POA: Diagnosis not present

## 2018-01-21 DIAGNOSIS — R42 Dizziness and giddiness: Secondary | ICD-10-CM | POA: Diagnosis not present

## 2018-03-11 ENCOUNTER — Other Ambulatory Visit: Payer: Self-pay | Admitting: Internal Medicine

## 2018-03-12 ENCOUNTER — Other Ambulatory Visit: Payer: Self-pay

## 2018-03-12 ENCOUNTER — Ambulatory Visit: Payer: Self-pay | Admitting: Nurse Practitioner

## 2018-03-12 DIAGNOSIS — E039 Hypothyroidism, unspecified: Secondary | ICD-10-CM

## 2018-03-12 MED ORDER — LEVOTHYROXINE SODIUM 112 MCG PO TABS: 112.0000 ug | ORAL_TABLET | Freq: Every morning | ORAL | 3 refills | Status: DC

## 2018-03-19 DIAGNOSIS — H52223 Regular astigmatism, bilateral: Secondary | ICD-10-CM | POA: Diagnosis not present

## 2018-04-01 ENCOUNTER — Other Ambulatory Visit: Payer: Self-pay | Admitting: Nurse Practitioner

## 2018-04-01 DIAGNOSIS — K219 Gastro-esophageal reflux disease without esophagitis: Secondary | ICD-10-CM

## 2018-04-01 MED ORDER — PANTOPRAZOLE SODIUM 40 MG PO TBEC: 40.0000 mg | DELAYED_RELEASE_TABLET | Freq: Every day | ORAL | 3 refills | Status: DC

## 2018-04-08 ENCOUNTER — Ambulatory Visit: Payer: Self-pay | Admitting: Nurse Practitioner

## 2018-04-08 ENCOUNTER — Encounter: Payer: Self-pay | Admitting: Nurse Practitioner

## 2018-04-08 VITALS — BP 124/74 | HR 73 | Resp 16 | Ht 67.0 in | Wt 233.8 lb

## 2018-04-08 DIAGNOSIS — E1165 Type 2 diabetes mellitus with hyperglycemia: Secondary | ICD-10-CM

## 2018-04-08 DIAGNOSIS — E039 Hypothyroidism, unspecified: Secondary | ICD-10-CM

## 2018-04-08 DIAGNOSIS — M5441 Lumbago with sciatica, right side: Secondary | ICD-10-CM

## 2018-04-08 LAB — POCT GLYCOSYLATED HEMOGLOBIN (HGB A1C): HEMOGLOBIN A1C: 5.3 % (ref 4.0–5.6)

## 2018-04-08 MED ORDER — METFORMIN HCL 500 MG PO TABS: 500.0000 mg | ORAL_TABLET | Freq: Two times a day (BID) | ORAL | 3 refills | Status: DC

## 2018-04-08 NOTE — Progress Notes (Signed)
[facility] [address] [place], [place] [address]  Internal MEDICINE  Office Visit Note  Patient Name: Jo Watkins  [id number] 13, 2057  [id number]  Date of Service: 04/27/2018  Chief Complaint  Patient presents with  . Diabetes    Diabetes  She presents for her follow-up diabetic visit. She has type 2 diabetes mellitus. Her disease course has been stable. There are no hypoglycemic associated symptoms. Pertinent negatives for hypoglycemia include no nervousness/anxiousness. There are no diabetic associated symptoms. Pertinent negatives for diabetes include no chest pain and no fatigue. There are no hypoglycemic complications. Symptoms are stable. There are no diabetic complications. Risk factors for coronary artery disease include post-menopausal and obesity. Current diabetic treatment includes oral agent (monotherapy). She is compliant with treatment most of the time. Her weight is stable. She is following a generally healthy diet. Meal planning includes avoidance of concentrated sweets. She has not had a previous visit with a dietitian. She participates in exercise intermittently. There is no change in her home blood glucose trend. An ACE inhibitor/angiotensin II receptor blocker is not being taken. She does not see a podiatrist.Eye exam is not current.       Current Medication: Outpatient Encounter Medications as of 04/08/2018  Medication Sig  . Cetirizine HCl (ZYRTEC ALLERGY) 10 MG CAPS Take by mouth.  . gabapentin (NEURONTIN) 100 MG capsule TAKE 1 CAPSULE BY MOUTH THREE TIMES DAILY  . HYDROcodone-acetaminophen (NORCO/VICODIN) 5-325 MG tablet Take 1 tablet by mouth every 6 (six) hours as needed for moderate pain.  Marland Kitchen. levothyroxine (SYNTHROID, LEVOTHROID) 112 MCG tablet Take 1 tablet (112 mcg total) by mouth every morning.  . meclizine (ANTIVERT) 25 MG tablet Take 1 tablet (25 mg total) by mouth 3 (three) times daily as needed for dizziness.  . meloxicam (MOBIC) 7.5 MG tablet  Take 1 tablet (7.5 mg total) by mouth 2 (two) times daily as needed for pain.  . metFORMIN (GLUCOPHAGE) 500 MG tablet Take 1 tablet (500 mg total) by mouth 2 (two) times daily with a meal.  . mometasone (NASONEX) 50 MCG/ACT nasal spray Place into the nose.  . nystatin cream (MYCOSTATIN) APPLY EXTERNALLY TO THE AFFECTED AREA TWICE DAILY AS NEEDED FOR RASH  . pantoprazole (PROTONIX) 40 MG tablet Take 1 tablet (40 mg total) by mouth daily.  . Phendimetrazine Tartrate 105 MG CP24 Take by mouth.  . [DISCONTINUED] cyclobenzaprine (FLEXERIL) 10 MG tablet Take 1 tablet (10 mg total) by mouth 2 (two) times daily as needed for muscle spasms.  . [DISCONTINUED] metFORMIN (GLUCOPHAGE) 500 MG tablet    No facility-administered encounter medications on file as of 04/08/2018.     Surgical History: Past Surgical History:  Procedure Laterality Date  . CHOLECYSTECTOMY      Medical History: Past Medical History:  Diagnosis Date  . Hypothyroidism     Family History: Family History  Problem Relation Age of Onset  . Anemia Mother   . Brain cancer Father   . Prostate cancer Brother   . Prostate cancer Maternal Uncle   . Stroke Maternal Grandfather   . Breast cancer Paternal Grandmother   . Skin cancer Paternal Grandfather     Social History   Socioeconomic History  . Marital status: Married    Spouse name: Not on file  . Number of children: Not on file  . Years of education: Not on file  . Highest education level: Not on file  Occupational History  . Not on file  Social Needs  . Financial  resource strain: Not on file  . Food insecurity:    Worry: Not on file    Inability: Not on file  . Transportation needs:    Medical: Not on file    Non-medical: Not on file  Tobacco Use  . Smoking status: Former Games developer  . Smokeless tobacco: Never Used  Substance and Sexual Activity  . Alcohol use: No    Frequency: Never  . Drug use: No  . Sexual activity: Not on file  Lifestyle  . Physical  activity:    Days per week: Not on file    Minutes per session: Not on file  . Stress: Not on file  Relationships  . Social connections:    Talks on phone: Not on file    Gets together: Not on file    Attends religious service: Not on file    Active member of club or organization: Not on file    Attends meetings of clubs or organizations: Not on file    Relationship status: Not on file  . Intimate partner violence:    Fear of current or ex partner: Not on file    Emotionally abused: Not on file    Physically abused: Not on file    Forced sexual activity: Not on file  Other Topics Concern  . Not on file  Social History Narrative  . Not on file      Review of Systems  Constitutional: Negative for activity change, appetite change, fatigue and unexpected weight change.  HENT: Negative for congestion, postnasal drip, rhinorrhea, sinus pressure and sore throat.   Eyes: Negative.   Respiratory: Negative for cough, shortness of breath and wheezing.   Cardiovascular: Negative for chest pain and palpitations.  Gastrointestinal: Negative for abdominal pain, constipation, diarrhea, nausea and vomiting.  Endocrine:       Blood sugars doing well   Musculoskeletal: Positive for arthralgias and myalgias.       The patient having moderate left knee pain. Also has cyst like lesion at base of her thumb which is getting larger and more tender.   Skin: Negative for rash.  Allergic/Immunologic: Positive for environmental allergies.  Hematological: Negative for adenopathy. Does not bruise/bleed easily.  Psychiatric/Behavioral: The patient is not nervous/anxious.     Today's Vitals   04/08/18 1542  BP: 124/74  Pulse: 73  Resp: 16  SpO2: 94%  Weight: 233 lb 12.8 oz (106.1 kg)  Height: 5\' 7"  (1.702 m)    Physical Exam  Constitutional: She is oriented to person, place, and time. She appears well-developed and well-nourished. No distress.  HENT:  Head: Normocephalic and atraumatic.  Nose:  Nose normal.  Mouth/Throat: Oropharynx is clear and moist. No oropharyngeal exudate.  Eyes: Pupils are equal, round, and reactive to light. Conjunctivae and EOM are normal.  Neck: Normal range of motion. Neck supple. No JVD present. Carotid bruit is not present. No tracheal deviation present. No thyromegaly present.  Cardiovascular: Normal rate, regular rhythm and normal heart sounds. Exam reveals no gallop and no friction rub.  No murmur heard. Pulmonary/Chest: Effort normal and breath sounds normal. No respiratory distress. She has no wheezes. She has no rales. She exhibits no tenderness.  Abdominal: Soft. Bowel sounds are normal. There is no tenderness.  Musculoskeletal: Normal range of motion.  Lymphadenopathy:    She has no cervical adenopathy.  Neurological: She is alert and oriented to person, place, and time. No cranial nerve deficit.  Skin: Skin is warm and dry. She is not  diaphoretic.  Psychiatric: She has a normal mood and affect. Her behavior is normal. Judgment and thought content normal.  Nursing note and vitals reviewed.   Assessment/Plan:  1. Uncontrolled type 2 diabetes mellitus with hyperglycemia (HCC) - POCT HgB A1C 5.3 today. Continue metformin as prescribed. Follow ADA diet.  - metFORMIN (GLUCOPHAGE) 500 MG tablet; Take 1 tablet (500 mg total) by mouth 2 (two) times daily with a meal.  Dispense: 60 tablet; Refill: 3  2. Acquired hypothyroidism Continue levothyroxine as prescribed.   3. Low back pain with right-sided sciatica, unspecified back pain laterality, unspecified chronicity Continue meloxicam every day. May take gabapentin as needed and as prescribed for sciatic nerve pain.   General Counseling: io dieujuste understanding of the findings of todays visit and agrees with plan of treatment. I have discussed any further diagnostic evaluation that may be needed or ordered today. We also reviewed her medications today. she has been encouraged to call the  office with any questions or concerns that should arise related to todays visit.    Counseling:  Diabetes Counseling:  1. Addition of ACE inh/ ARB'S for nephroprotection. 2. Diabetic foot care, prevention of complications.  3.Exercise and lose weight.  4. Diabetic eye examination, 5. Monitor blood sugar closlely. nutrition counseling.  6.Sign and symptoms of hypoglycemia including shaking sweating,confusion and headaches.  This patient was seen by Vincent Gros, FNP- C in Collaboration with Dr Lyndon Code as a part of collaborative care agreement   Orders Placed This Encounter  Procedures  . POCT HgB A1C    Meds ordered this encounter  Medications  . metFORMIN (GLUCOPHAGE) 500 MG tablet    Sig: Take 1 tablet (500 mg total) by mouth 2 (two) times daily with a meal.    Dispense:  60 tablet    Refill:  3    Order Specific Question:   Supervising Provider    Answer:   Lyndon Code [[id number]]    Time spent: 35 Minutes     Dr Lyndon Code Internal medicine

## 2018-04-10 ENCOUNTER — Other Ambulatory Visit: Payer: Self-pay | Admitting: Internal Medicine

## 2018-04-10 DIAGNOSIS — M545 Low back pain, unspecified: Secondary | ICD-10-CM

## 2018-04-29 ENCOUNTER — Other Ambulatory Visit: Payer: Self-pay | Admitting: Nurse Practitioner

## 2018-04-29 DIAGNOSIS — R42 Dizziness and giddiness: Secondary | ICD-10-CM

## 2018-04-29 MED ORDER — MECLIZINE HCL 25 MG PO TABS: 25.0000 mg | ORAL_TABLET | Freq: Three times a day (TID) | ORAL | 3 refills | Status: AC | PRN

## 2018-06-25 ENCOUNTER — Other Ambulatory Visit: Payer: Self-pay | Admitting: Nurse Practitioner

## 2018-06-25 DIAGNOSIS — E669 Obesity, unspecified: Secondary | ICD-10-CM

## 2018-06-25 MED ORDER — PHENDIMETRAZINE TARTRATE ER 105 MG PO CP24: 105.0000 mg | ORAL_CAPSULE | Freq: Every day | ORAL | 2 refills | Status: DC

## 2018-06-25 NOTE — Progress Notes (Signed)
Renewed Bontril SR daily #30 with 2 refills. Sent to [facility].

## 2018-07-19 ENCOUNTER — Other Ambulatory Visit: Payer: Self-pay

## 2018-07-19 DIAGNOSIS — E039 Hypothyroidism, unspecified: Secondary | ICD-10-CM

## 2018-07-19 MED ORDER — LEVOTHYROXINE SODIUM 112 MCG PO TABS: 112.0000 ug | ORAL_TABLET | Freq: Every morning | ORAL | 3 refills | Status: DC

## 2018-08-08 ENCOUNTER — Ambulatory Visit: Payer: Self-pay | Admitting: Nurse Practitioner

## 2018-08-08 ENCOUNTER — Encounter: Payer: Self-pay | Admitting: Nurse Practitioner

## 2018-08-08 VITALS — BP 145/70 | HR 79 | Resp 16 | Ht 67.0 in | Wt 236.0 lb

## 2018-08-08 DIAGNOSIS — E669 Obesity, unspecified: Secondary | ICD-10-CM

## 2018-08-08 DIAGNOSIS — M5441 Lumbago with sciatica, right side: Secondary | ICD-10-CM

## 2018-08-08 DIAGNOSIS — K219 Gastro-esophageal reflux disease without esophagitis: Secondary | ICD-10-CM

## 2018-08-08 DIAGNOSIS — N3281 Overactive bladder: Secondary | ICD-10-CM

## 2018-08-08 DIAGNOSIS — E1165 Type 2 diabetes mellitus with hyperglycemia: Secondary | ICD-10-CM

## 2018-08-08 MED ORDER — MELOXICAM 15 MG PO TABS: 7.5000 mg | ORAL_TABLET | Freq: Every day | ORAL | 3 refills | Status: DC

## 2018-08-08 MED ORDER — METFORMIN HCL 500 MG PO TABS: 500.0000 mg | ORAL_TABLET | Freq: Two times a day (BID) | ORAL | 3 refills | Status: DC

## 2018-08-08 MED ORDER — OXYBUTYNIN CHLORIDE 5 MG PO TABS: 5.0000 mg | ORAL_TABLET | Freq: Three times a day (TID) | ORAL | 2 refills | Status: DC

## 2018-08-08 MED ORDER — PANTOPRAZOLE SODIUM 40 MG PO TBEC: 40.0000 mg | DELAYED_RELEASE_TABLET | Freq: Every day | ORAL | 3 refills | Status: DC

## 2018-08-08 MED ORDER — PHENDIMETRAZINE TARTRATE ER 105 MG PO CP24: 105.0000 mg | ORAL_CAPSULE | Freq: Every day | ORAL | 2 refills | Status: DC

## 2018-08-08 NOTE — Progress Notes (Signed)
[facility] [address] [place], [place] [address]  Internal MEDICINE  Office Visit Note  Patient Name: Jo Watkins  [id number]  [id number]  Date of Service: 08/08/2018  Chief Complaint  Patient presents with  . Hypothyroidism  . Over Active Bladder    The patient is c/o overactive bladder. She finds that when she has to go to the bathroom, the need is immediate. If she doesn't go to the bathroom, she will have accident. No dysuria or hematuria present.  Weight gain of 3 pounds. Has been on Bontril SR in the past and has done well. Feels like losing weight will also help with overactive bladder.   Diabetes  She presents for her follow-up diabetic visit. She has type 2 diabetes mellitus. Her disease course has been stable. There are no hypoglycemic associated symptoms. Pertinent negatives for hypoglycemia include no dizziness, headaches or nervousness/anxiousness. There are no diabetic associated symptoms. Pertinent negatives for diabetes include no chest pain, no fatigue, no polydipsia, no polyphagia and no polyuria. There are no hypoglycemic complications. Symptoms are stable. There are no diabetic complications. Risk factors for coronary artery disease include post-menopausal and obesity. Current diabetic treatment includes oral agent (monotherapy). She is compliant with treatment most of the time. Her weight is stable. She is following a generally healthy diet. Meal planning includes avoidance of concentrated sweets. She has not had a previous visit with a dietitian. She participates in exercise intermittently. There is no change in her home blood glucose trend. An ACE inhibitor/angiotensin II receptor blocker is not being taken. She does not see a podiatrist.Eye exam is not current.       Current Medication: Outpatient Encounter Medications as of 08/08/2018  Medication Sig  . Cetirizine HCl (ZYRTEC ALLERGY) 10 MG CAPS Take by mouth.  . cyclobenzaprine (FLEXERIL) 10  MG tablet TAKE 1 TABLET BY MOUTH TWICE DAILY AS NEEDED FOR BACK PAIN OR SPASMS  . gabapentin (NEURONTIN) 100 MG capsule TAKE 1 CAPSULE BY MOUTH THREE TIMES DAILY  . HYDROcodone-acetaminophen (NORCO/VICODIN) 5-325 MG tablet Take 1 tablet by mouth every 6 (six) hours as needed for moderate pain.  Marland Kitchen levothyroxine (SYNTHROID, LEVOTHROID) 112 MCG tablet Take 1 tablet (112 mcg total) by mouth every morning.  . meclizine (ANTIVERT) 25 MG tablet Take 1 tablet (25 mg total) by mouth 3 (three) times daily as needed for dizziness.  . meloxicam (MOBIC) 15 MG tablet Take 0.5 tablets (7.5 mg total) by mouth daily.  . metFORMIN (GLUCOPHAGE) 500 MG tablet Take 1 tablet (500 mg total) by mouth 2 (two) times daily with a meal.  . mometasone (NASONEX) 50 MCG/ACT nasal spray Place into the nose.  . nystatin cream (MYCOSTATIN) APPLY EXTERNALLY TO THE AFFECTED AREA TWICE DAILY AS NEEDED FOR RASH  . oxybutynin (DITROPAN) 5 MG tablet Take 1 tablet (5 mg total) by mouth 3 (three) times daily.  . pantoprazole (PROTONIX) 40 MG tablet Take 1 tablet (40 mg total) by mouth daily.  . Phendimetrazine Tartrate 105 MG CP24 Take 1 capsule (105 mg total) by mouth daily.  . [DISCONTINUED] meloxicam (MOBIC) 7.5 MG tablet Take 1 tablet (7.5 mg total) by mouth 2 (two) times daily as needed for pain.  . [DISCONTINUED] metFORMIN (GLUCOPHAGE) 500 MG tablet Take 1 tablet (500 mg total) by mouth 2 (two) times daily with a meal.  . [DISCONTINUED] pantoprazole (PROTONIX) 40 MG tablet Take 1 tablet (40 mg total) by mouth daily.  . [DISCONTINUED] Phendimetrazine Tartrate 105 MG CP24 Take 1 capsule (  105 mg total) by mouth daily.   No facility-administered encounter medications on file as of 08/08/2018.     Surgical History: Past Surgical History:  Procedure Laterality Date  . CHOLECYSTECTOMY      Medical History: Past Medical History:  Diagnosis Date  . Hypothyroidism     Family History: Family History  Problem Relation Age of  Onset  . Anemia Mother   . Brain cancer Father   . Prostate cancer Brother   . Prostate cancer Maternal Uncle   . Stroke Maternal Grandfather   . Breast cancer Paternal Grandmother   . Skin cancer Paternal Grandfather     Social History   Socioeconomic History  . Marital status: Married    Spouse name: Not on file  . Number of children: Not on file  . Years of education: Not on file  . Highest education level: Not on file  Occupational History  . Not on file  Social Needs  . Financial resource strain: Not on file  . Food insecurity:    Worry: Not on file    Inability: Not on file  . Transportation needs:    Medical: Not on file    Non-medical: Not on file  Tobacco Use  . Smoking status: Former Games developer  . Smokeless tobacco: Never Used  Substance and Sexual Activity  . Alcohol use: No    Frequency: Never  . Drug use: No  . Sexual activity: Not on file  Lifestyle  . Physical activity:    Days per week: Not on file    Minutes per session: Not on file  . Stress: Not on file  Relationships  . Social connections:    Talks on phone: Not on file    Gets together: Not on file    Attends religious service: Not on file    Active member of club or organization: Not on file    Attends meetings of clubs or organizations: Not on file    Relationship status: Not on file  . Intimate partner violence:    Fear of current or ex partner: Not on file    Emotionally abused: Not on file    Physically abused: Not on file    Forced sexual activity: Not on file  Other Topics Concern  . Not on file  Social History Narrative  . Not on file      Review of Systems  Constitutional: Negative for activity change, appetite change, fatigue and unexpected weight change.       Three pound weight gain since most recent visit.   HENT: Negative for congestion, postnasal drip, rhinorrhea, sinus pressure and sore throat.   Eyes: Negative.   Respiratory: Negative for cough, shortness of breath  and wheezing.   Cardiovascular: Negative for chest pain and palpitations.  Gastrointestinal: Negative for abdominal pain, constipation, diarrhea, nausea and vomiting.       Moderate GERD  Endocrine: Negative for cold intolerance, heat intolerance, polydipsia, polyphagia and polyuria.       Blood sugars doing well   Genitourinary: Positive for frequency and urgency.  Musculoskeletal: Positive for arthralgias and myalgias.       The patient having moderate left knee pain. Also has cyst like lesion at base of her thumb which is getting larger and more tender.   Skin: Negative for rash.  Allergic/Immunologic: Positive for environmental allergies.  Neurological: Negative for dizziness and headaches.  Hematological: Negative for adenopathy. Does not bruise/bleed easily.  Psychiatric/Behavioral: The patient is not nervous/anxious.  Today's Vitals   08/08/18 1407  BP: (!) 145/70  Pulse: 79  Resp: 16  SpO2: 98%  Weight: 236 lb (107 kg)  Height: 5\' 7"  (1.702 m)    Physical Exam  Constitutional: She is oriented to person, place, and time. She appears well-developed and well-nourished. No distress.  HENT:  Head: Normocephalic and atraumatic.  Nose: Nose normal.  Mouth/Throat: Oropharynx is clear and moist. No oropharyngeal exudate.  Eyes: Pupils are equal, round, and reactive to light. Conjunctivae and EOM are normal.  Neck: Normal range of motion. Neck supple. No JVD present. Carotid bruit is not present. No tracheal deviation present. No thyromegaly present.  Cardiovascular: Normal rate, regular rhythm and normal heart sounds. Exam reveals no gallop and no friction rub.  No murmur heard. Pulmonary/Chest: Effort normal and breath sounds normal. No respiratory distress. She has no wheezes. She has no rales. She exhibits no tenderness.  Abdominal: Soft. Bowel sounds are normal. There is no tenderness.  Musculoskeletal: Normal range of motion.  Lymphadenopathy:    She has no cervical  adenopathy.  Neurological: She is alert and oriented to person, place, and time. No cranial nerve deficit.  Skin: Skin is warm and dry. Capillary refill takes less than 2 seconds. She is not diaphoretic.  Psychiatric: She has a normal mood and affect. Her behavior is normal. Judgment and thought content normal.  Nursing note and vitals reviewed.   Assessment/Plan: 1. Overactive bladder Start oxybutynin 5mg . May be taken up to three times daily if needed.  - oxybutynin (DITROPAN) 5 MG tablet; Take 1 tablet (5 mg total) by mouth 3 (three) times daily.  Dispense: 90 tablet; Refill: 2  2. Uncontrolled type 2 diabetes mellitus with hyperglycemia (HCC) Stable blood sugars. Continue metformin 500mg  bid.  - metFORMIN (GLUCOPHAGE) 500 MG tablet; Take 1 tablet (500 mg total) by mouth 2 (two) times daily with a meal.  Dispense: 180 tablet; Refill: 3  3. Gastroesophageal reflux disease without esophagitis - pantoprazole (PROTONIX) 40 MG tablet; Take 1 tablet (40 mg total) by mouth daily.  Dispense: 90 tablet; Refill: 3  4. Low back pain with right-sided sciatica, unspecified back pain laterality, unspecified chronicity May take meloxicam 15mg  daily when needed for pain/inflammation.  - meloxicam (MOBIC) 15 MG tablet; Take 0.5 tablets (7.5 mg total) by mouth daily.  Dispense: 90 tablet; Refill: 3  5. Mild obesity Restart bontril SR daily. Limit calorie intake to 1200 to 1500 calories per day. Gradually increase routine cardiovascular exercise.  - Phendimetrazine Tartrate 105 MG CP24; Take 1 capsule (105 mg total) by mouth daily.  Dispense: 30 each; Refill: 2  General Counseling: cricket goodlin understanding of the findings of todays visit and agrees with plan of treatment. I have discussed any further diagnostic evaluation that may be needed or ordered today. We also reviewed her medications today. she has been encouraged to call the office with any questions or concerns that should arise related to  todays visit.    There is a liability release in patients' chart. There has been a 10 minute discussion about the side effects including but not limited to elevated blood pressure, anxiety, lack of sleep and dry mouth. Pt understands and will like to start/continue on appetite suppressant at this time. There will be one month RX given at the time of visit with proper follow up. Nova diet plan with restricted calories is given to the pt. Pt understands and agrees with  plan of treatment  This patient was seen by  Vincent Gros FNP Collaboration with Dr Lyndon Code as a part of collaborative care agreement  Meds ordered this encounter  Medications  . oxybutynin (DITROPAN) 5 MG tablet    Sig: Take 1 tablet (5 mg total) by mouth 3 (three) times daily.    Dispense:  90 tablet    Refill:  2    Please run with GoodRx.  Patient given a GoodRX card for this today.    Order Specific Question:   Supervising Provider    Answer:   Lyndon Code [[id number]]  . meloxicam (MOBIC) 15 MG tablet    Sig: Take 0.5 tablets (7.5 mg total) by mouth daily.    Dispense:  90 tablet    Refill:  3    Please run with GoodRx program. Patient given goodrx card today.    Order Specific Question:   Supervising Provider    Answer:   Lyndon Code [[id number]]  . metFORMIN (GLUCOPHAGE) 500 MG tablet    Sig: Take 1 tablet (500 mg total) by mouth 2 (two) times daily with a meal.    Dispense:  180 tablet    Refill:  3    Please run with GoodRx card. Patient given GoodRx card today.    Order Specific Question:   Supervising Provider    Answer:   Lyndon Code [[id number]]  . pantoprazole (PROTONIX) 40 MG tablet    Sig: Take 1 tablet (40 mg total) by mouth daily.    Dispense:  90 tablet    Refill:  3    Please run with Goodrx. Patient given good rx card at today's visit    Order Specific Question:   Supervising Provider    Answer:   Lyndon Code [[id number]]  . Phendimetrazine Tartrate 105 MG CP24    Sig: Take 1 capsule (105 mg total)  by mouth daily.    Dispense:  30 each    Refill:  2    Please fill with GoodRX. Patient given GoodRx card today.    Order Specific Question:   Supervising Provider    Answer:   Lyndon Code [[id number]]    Time spent: 21 Minutes      Dr Lyndon Code Internal medicine

## 2018-12-09 ENCOUNTER — Ambulatory Visit: Payer: Self-pay | Admitting: Nurse Practitioner

## 2019-01-16 ENCOUNTER — Ambulatory Visit: Payer: Self-pay | Admitting: Nurse Practitioner

## 2019-02-04 ENCOUNTER — Other Ambulatory Visit: Payer: Self-pay

## 2019-02-04 DIAGNOSIS — N3281 Overactive bladder: Secondary | ICD-10-CM

## 2019-02-04 MED ORDER — OXYBUTYNIN CHLORIDE 5 MG PO TABS: 5.0000 mg | ORAL_TABLET | Freq: Three times a day (TID) | ORAL | 0 refills | Status: DC

## 2019-02-04 MED ORDER — GABAPENTIN 100 MG PO CAPS: 100.0000 mg | ORAL_CAPSULE | Freq: Three times a day (TID) | ORAL | 0 refills | Status: DC

## 2019-02-06 ENCOUNTER — Other Ambulatory Visit: Payer: Self-pay

## 2019-02-06 ENCOUNTER — Encounter: Payer: Self-pay | Admitting: Nurse Practitioner

## 2019-02-06 ENCOUNTER — Ambulatory Visit: Payer: BC Managed Care – PPO | Admitting: Nurse Practitioner

## 2019-02-06 VITALS — Ht 67.5 in | Wt 236.0 lb

## 2019-02-06 DIAGNOSIS — M5441 Lumbago with sciatica, right side: Secondary | ICD-10-CM | POA: Diagnosis not present

## 2019-02-06 DIAGNOSIS — J309 Allergic rhinitis, unspecified: Secondary | ICD-10-CM

## 2019-02-06 DIAGNOSIS — M25562 Pain in left knee: Secondary | ICD-10-CM | POA: Diagnosis not present

## 2019-02-06 DIAGNOSIS — E1165 Type 2 diabetes mellitus with hyperglycemia: Secondary | ICD-10-CM

## 2019-02-06 DIAGNOSIS — N3281 Overactive bladder: Secondary | ICD-10-CM

## 2019-02-06 MED ORDER — OXYBUTYNIN CHLORIDE ER 10 MG PO TB24: 10.0000 mg | ORAL_TABLET | Freq: Every day | ORAL | 5 refills | Status: DC

## 2019-02-06 MED ORDER — HYDROCODONE-ACETAMINOPHEN 5-325 MG PO TABS: 1.0000 | ORAL_TABLET | Freq: Four times a day (QID) | ORAL | 0 refills | Status: DC | PRN

## 2019-02-06 NOTE — Progress Notes (Signed)
[facility] [address] [place], [place] [address]  Internal MEDICINE  Telephone Visit  Patient Name: Jo Watkins  [id number]  [id number]  Date of Service: 02/09/2019  I connected with the patient at 333pm by webcam and verified the patients identity verified using two identifiers.   I discussed the limitations, risks, security and privacy concerns of performing an evaluation and management service by webcam and the availability of in person appointments. I also discussed with the patient that there may be a patient responsible charge related to the service.  The patient expressed understanding and agrees to proceed.    Chief Complaint  Patient presents with  . Medical Management of Chronic Issues    medication refills, pt have been having some dizziness    The patient has been contacted via webcam for follow up visit due to concerns for spread of novel coronavirus. Today, she is c/o intermittent dizziness. This is worse when she turns her head from left to right. She does have history of allergic rhinitis. Has started taking benadryl as needed. Does report an improvement in vertigo type symptoms after she takes the benadryl. The patient has intermittent joint pain in her back and in her knees. Pain is often worse when the weather is cold and damp. Joint pain is generally well managed with meloxicam  daily. She does, occaisionally have to take prescribed hydrocodone/APAP. She takes this only when pain becomes more than 7/10 in severity. The last time she was given a prescription for this was in February, 2019. She would like to have a new prescription for this if possible.  She is also reporting improvement in overactive bladder since starting on oxybutynin  as needed. She does still have some leakage of urine most days. Would like to try a higher dose of this medication.       Current Medication: Outpatient Encounter Medications as of 02/06/2019  Medication Sig  .  Cetirizine HCl (ZYRTEC ALLERGY) 10 MG CAPS Take by mouth.  . cyclobenzaprine (FLEXERIL) 10 MG tablet TAKE 1 TABLET BY MOUTH TWICE DAILY AS NEEDED FOR BACK PAIN OR SPASMS  . gabapentin (NEURONTIN) 100 MG capsule Take 1 capsule (100 mg total) by mouth 3 (three) times daily.  Marland Kitchen HYDROcodone-acetaminophen (NORCO/VICODIN) 5-325 MG tablet Take 1 tablet by mouth every 6 (six) hours as needed for moderate pain.  Marland Kitchen levothyroxine (SYNTHROID, LEVOTHROID) 112 MCG tablet Take 1 tablet (112 mcg total) by mouth every morning.  . meclizine (ANTIVERT) 25 MG tablet Take 1 tablet (25 mg total) by mouth 3 (three) times daily as needed for dizziness.  . meloxicam (MOBIC) 15 MG tablet Take 0.5 tablets (7.5 mg total) by mouth daily.  . metFORMIN (GLUCOPHAGE) 500 MG tablet Take 1 tablet (500 mg total) by mouth 2 (two) times daily with a meal.  . mometasone (NASONEX) 50 MCG/ACT nasal spray Place into the nose.  . nystatin cream (MYCOSTATIN) APPLY EXTERNALLY TO THE AFFECTED AREA TWICE DAILY AS NEEDED FOR RASH  . pantoprazole (PROTONIX) 40 MG tablet Take 1 tablet (40 mg total) by mouth daily.  . Phendimetrazine Tartrate 105 MG CP24 Take 1 capsule (105 mg total) by mouth daily.  . [DISCONTINUED] HYDROcodone-acetaminophen (NORCO/VICODIN) 5-325 MG tablet Take 1 tablet by mouth every 6 (six) hours as needed for moderate pain.  . [DISCONTINUED] oxybutynin (DITROPAN) 5 MG tablet Take 1 tablet (5 mg total) by mouth 3 (three) times daily.  Marland Kitchen oxybutynin (DITROPAN-XL) 10 MG 24 hr tablet Take 1 tablet (10 mg  total) by mouth at bedtime.   No facility-administered encounter medications on file as of 02/06/2019.     Surgical History: Past Surgical History:  Procedure Laterality Date  . CHOLECYSTECTOMY      Medical History: Past Medical History:  Diagnosis Date  . Hypothyroidism     Family History: Family History  Problem Relation Age of Onset  . Anemia Mother   . Brain cancer Father   . Prostate cancer Brother   . Prostate  cancer Maternal Uncle   . Stroke Maternal Grandfather   . Breast cancer Paternal Grandmother   . Skin cancer Paternal Grandfather     Social History   Socioeconomic History  . Marital status: Married    Spouse name: Not on file  . Number of children: Not on file  . Years of education: Not on file  . Highest education level: Not on file  Occupational History  . Not on file  Social Needs  . Financial resource strain: Not on file  . Food insecurity:    Worry: Not on file    Inability: Not on file  . Transportation needs:    Medical: Not on file    Non-medical: Not on file  Tobacco Use  . Smoking status: Former Games developermoker  . Smokeless tobacco: Never Used  Substance and Sexual Activity  . Alcohol use: No    Frequency: Never  . Drug use: No  . Sexual activity: Not on file  Lifestyle  . Physical activity:    Days per week: Not on file    Minutes per session: Not on file  . Stress: Not on file  Relationships  . Social connections:    Talks on phone: Not on file    Gets together: Not on file    Attends religious service: Not on file    Active member of club or organization: Not on file    Attends meetings of clubs or organizations: Not on file    Relationship status: Not on file  . Intimate partner violence:    Fear of current or ex partner: Not on file    Emotionally abused: Not on file    Physically abused: Not on file    Forced sexual activity: Not on file  Other Topics Concern  . Not on file  Social History Narrative  . Not on file      Review of Systems  Constitutional: Negative for activity change, appetite change, fatigue and unexpected weight change.  HENT: Negative for congestion, postnasal drip, rhinorrhea, sinus pressure and sore throat.   Respiratory: Negative for cough, shortness of breath and wheezing.   Cardiovascular: Negative for chest pain and palpitations.  Gastrointestinal: Negative for abdominal pain, constipation, diarrhea, nausea and vomiting.        Moderate GERD  Endocrine: Negative for cold intolerance, heat intolerance, polydipsia and polyuria.       Blood sugars doing well   Genitourinary: Positive for frequency and urgency.  Musculoskeletal: Positive for arthralgias and myalgias.       She is reporting moderate lower back and bilateral knee pain which is intermittent. Is more severe when the weather is cold and damp.   Skin: Negative for rash.  Allergic/Immunologic: Positive for environmental allergies.  Neurological: Negative for dizziness and headaches.  Hematological: Negative for adenopathy. Does not bruise/bleed easily.  Psychiatric/Behavioral: The patient is not nervous/anxious.     Today's Vitals   02/06/19 1515  Weight: 236 lb (107 kg)  Height: 5' 7.5" (1.715 m)  Body mass index is 36.42 kg/m.  Observation/Objective:  The patient is alert and oriented. She is in no acute distress.    Assessment/Plan: 1. Uncontrolled type 2 diabetes mellitus with hyperglycemia (HCC) Currently, states that blood sugars are doing well. Continue to take metformin as prescribed.   2. Low back pain with right-sided sciatica, unspecified back pain laterality, unspecified chronicity Continue to take meloxicam 15mg  daily to reduce pain and inflammation. May take hydrocodone/AAP 5/325mg  tablets as needed and as prescribed. A new prescription for #45 tablets was sent to her pharmacy.   3. Acute pain of left knee - HYDROcodone-acetaminophen (NORCO/VICODIN) 5-325 MG tablet; Take 1 tablet by mouth every 6 (six) hours as needed for moderate pain.  Dispense: 45 tablet; Refill: 0  4. Chronic allergic rhinitis Continue to take benadryl as needed and as indicated for allergy symptoms and vertigo.   5. Overactive bladder Increase ditropan to 10mg  XL to take daily.  - oxybutynin (DITROPAN-XL) 10 MG 24 hr tablet; Take 1 tablet (10 mg total) by mouth at bedtime.  Dispense: 30 tablet; Refill: 5  General Counseling: garima stodghill  understanding of the findings of today's phone visit and agrees with plan of treatment. I have discussed any further diagnostic evaluation that may be needed or ordered today. We also reviewed her medications today. she has been encouraged to call the office with any questions or concerns that should arise related to todays visit.  Reviewed risks and possible side effects associated with taking opiates, benzodiazepines and other CNS depressants. Combination of these could cause dizziness and drowsiness. Advised patient not to drive or operate machinery when taking these medications, as patient's and other's life can be at risk and will have consequences. Patient verbalized understanding in this matter. Dependence and abuse for these drugs will be monitored closely. A Controlled substance policy and procedure is on file which allows [facility] to order a urine drug screen test at any visit. Patient understands and agrees with the plan  Diabetes Counseling:  1. Addition of ACE inh/ ARB'S for nephroprotection. Microalbumin is updated  2. Diabetic foot care, prevention of complications. Podiatry consult 3. Exercise and lose weight.  4. Diabetic eye examination, Diabetic eye exam is updated  5. Monitor blood sugar closlely. nutrition counseling.  6. Sign and symptoms of hypoglycemia including shaking sweating,confusion and headaches.   This patient was seen by Vincent Gros FNP Collaboration with Dr Lyndon Code as a part of collaborative care agreement  Meds ordered this encounter  Medications  . HYDROcodone-acetaminophen (NORCO/VICODIN) 5-325 MG tablet    Sig: Take 1 tablet by mouth every 6 (six) hours as needed for moderate pain.    Dispense:  45 tablet    Refill:  0    Order Specific Question:   Supervising Provider    Answer:   Lyndon Code [[id number]]  . oxybutynin (DITROPAN-XL) 10 MG 24 hr tablet    Sig: Take 1 tablet (10 mg total) by mouth at bedtime.    Dispense:  30 tablet     Refill:  5    Order Specific Question:   Supervising Provider    Answer:   Lyndon Code [[id number]]    Time spent: 84 Minutes    Dr Lyndon Code Internal medicine

## 2019-02-09 DIAGNOSIS — J309 Allergic rhinitis, unspecified: Secondary | ICD-10-CM | POA: Insufficient documentation

## 2019-02-27 IMAGING — CR DG KNEE COMPLETE 4+V*L*
1 series · 4 of 4 positions shown · non-contrast
Comparison: None.

CLINICAL DATA: 61-year-old female with pain and swelling left knee
for 2 months. No injury. Initial encounter.

EXAM:
LEFT KNEE - COMPLETE 4+ VIEW

[Series 1: dg knee complete 4 views left · 0.14mm/px · 4 of 4 slices shown]
[im 1/4]
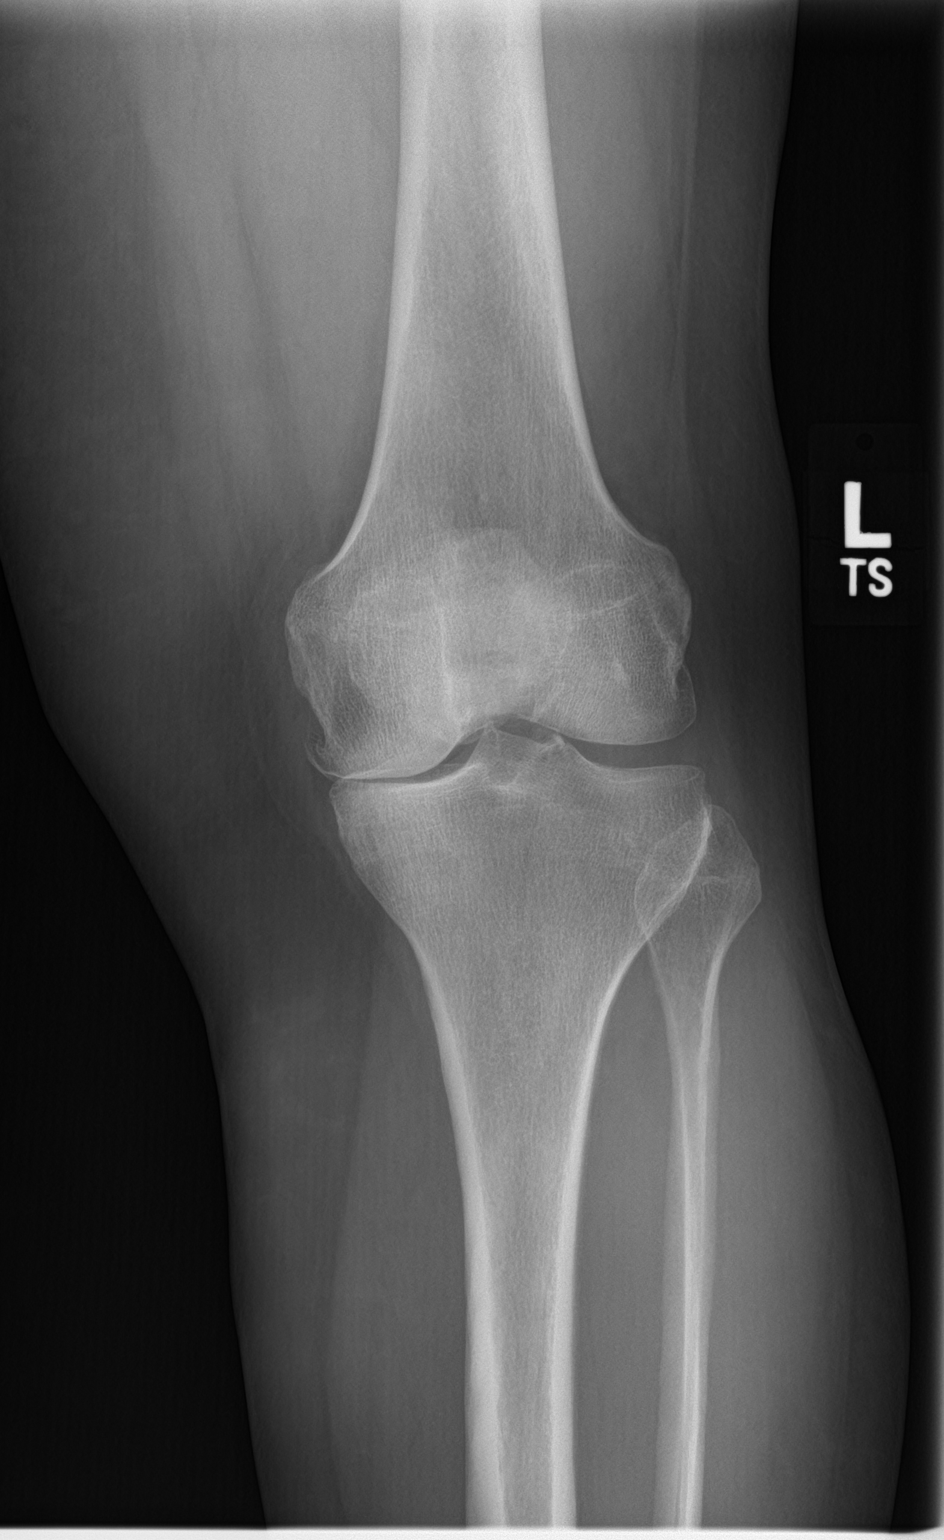
[im 2/4]
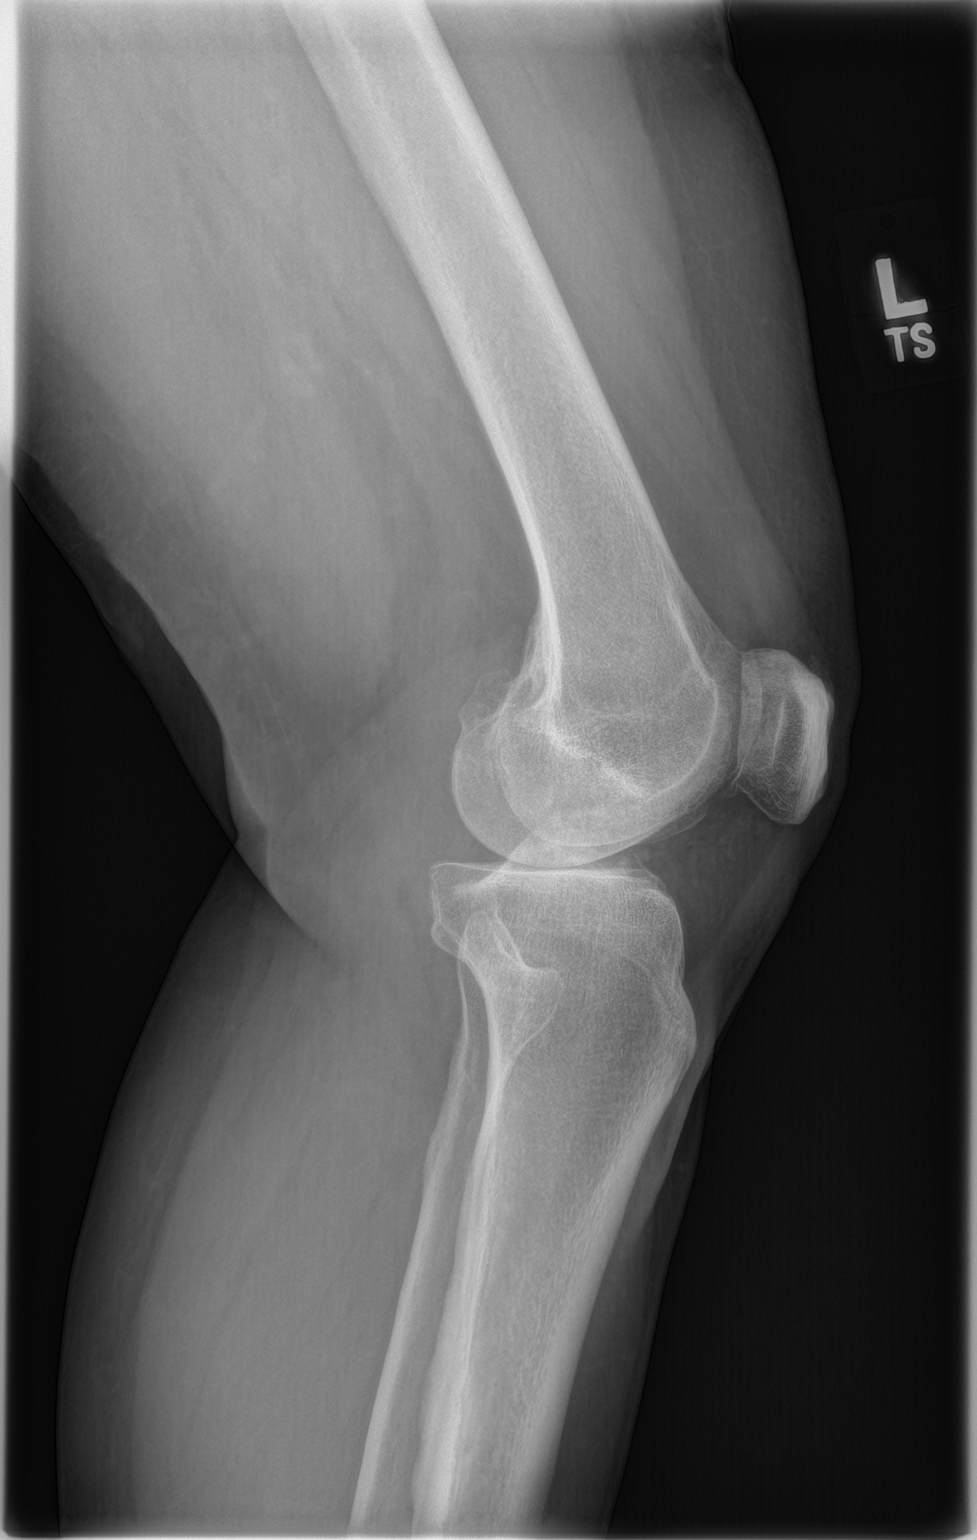
[im 3/4]
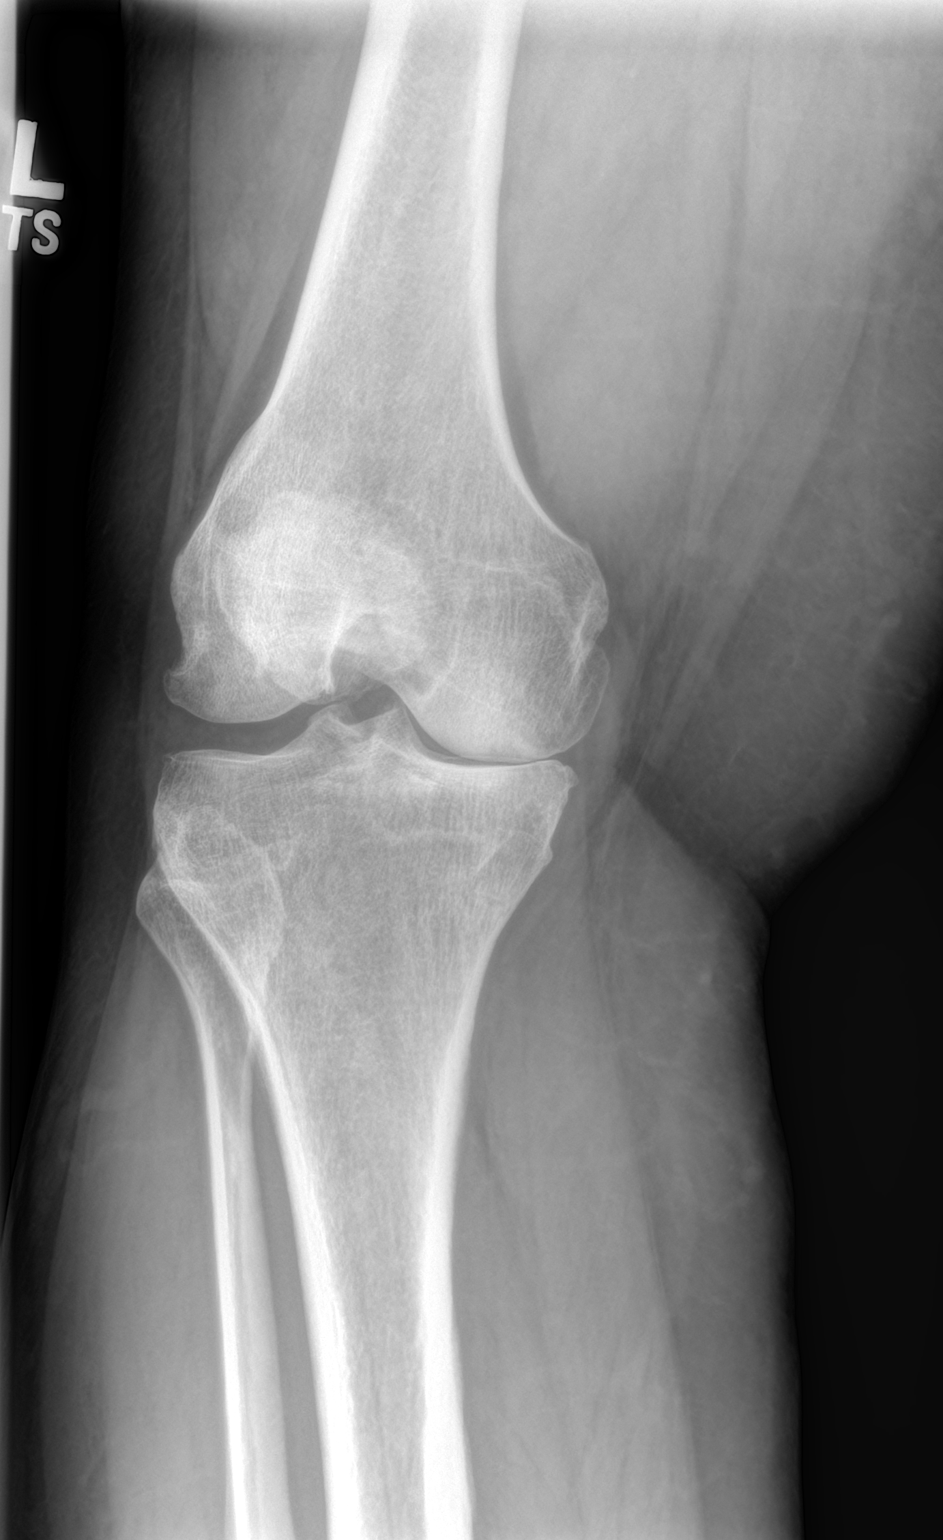
[im 4/4]
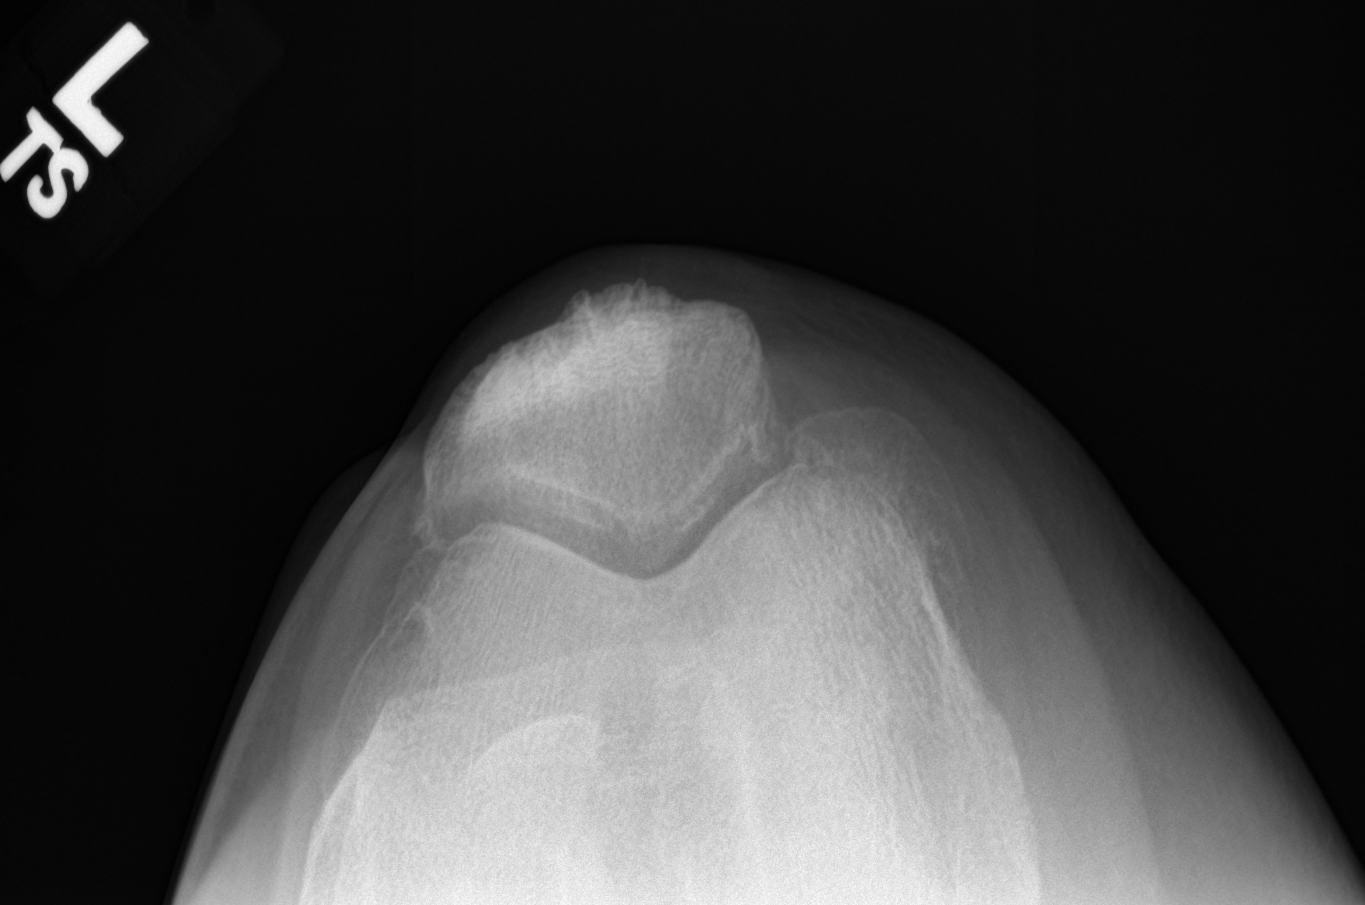

[4 of 4 positions shown; findings below may reference images not displayed]

FINDINGS: Marked medial tibiofemoral joint space narrowing with subchondral
sclerosis/cyst medial femoral condyle with medial osteophyte.

Mild patellofemoral joint degenerative changes.

No fracture or dislocation.
IMPRESSION: Marked medial tibiofemoral joint degenerative changes.

Mild patellofemoral joint degenerative changes.

## 2019-03-04 ENCOUNTER — Other Ambulatory Visit: Payer: Self-pay | Admitting: Nurse Practitioner

## 2019-03-04 ENCOUNTER — Other Ambulatory Visit: Payer: Self-pay

## 2019-03-04 DIAGNOSIS — N3281 Overactive bladder: Secondary | ICD-10-CM

## 2019-03-04 DIAGNOSIS — E039 Hypothyroidism, unspecified: Secondary | ICD-10-CM

## 2019-03-04 MED ORDER — GABAPENTIN 100 MG PO CAPS: 100.0000 mg | ORAL_CAPSULE | Freq: Three times a day (TID) | ORAL | 3 refills | Status: DC

## 2019-03-04 MED ORDER — OXYBUTYNIN CHLORIDE ER 10 MG PO TB24: 10.0000 mg | ORAL_TABLET | Freq: Every day | ORAL | 3 refills | Status: DC

## 2019-03-04 MED ORDER — LEVOTHYROXINE SODIUM 112 MCG PO TABS: 112.0000 ug | ORAL_TABLET | Freq: Every morning | ORAL | 3 refills | Status: DC

## 2019-05-01 ENCOUNTER — Other Ambulatory Visit: Payer: Self-pay

## 2019-05-01 DIAGNOSIS — M5441 Lumbago with sciatica, right side: Secondary | ICD-10-CM

## 2019-05-01 DIAGNOSIS — N3281 Overactive bladder: Secondary | ICD-10-CM

## 2019-05-01 MED ORDER — OXYBUTYNIN CHLORIDE ER 10 MG PO TB24: 10.0000 mg | ORAL_TABLET | Freq: Every day | ORAL | 1 refills | Status: DC

## 2019-05-01 MED ORDER — MELOXICAM 15 MG PO TABS: 15.0000 mg | ORAL_TABLET | Freq: Every day | ORAL | 1 refills | Status: DC

## 2019-05-01 NOTE — Telephone Encounter (Signed)
Pt called to refills her meloxicam 15 mg istead of hal day to once a day as per dr its ok and advised pt to take with food and skip some days

## 2019-05-05 ENCOUNTER — Other Ambulatory Visit: Payer: Self-pay | Admitting: Nurse Practitioner

## 2019-05-05 DIAGNOSIS — E039 Hypothyroidism, unspecified: Secondary | ICD-10-CM

## 2019-05-05 DIAGNOSIS — M545 Low back pain, unspecified: Secondary | ICD-10-CM

## 2019-05-05 MED ORDER — CYCLOBENZAPRINE HCL 10 MG PO TABS: ORAL_TABLET | ORAL | 1 refills | Status: DC

## 2019-05-05 MED ORDER — LEVOTHYROXINE SODIUM 112 MCG PO TABS: 112.0000 ug | ORAL_TABLET | Freq: Every morning | ORAL | 1 refills | Status: DC

## 2019-06-13 ENCOUNTER — Encounter: Payer: Self-pay | Admitting: Nurse Practitioner

## 2019-06-13 ENCOUNTER — Ambulatory Visit: Payer: BC Managed Care – PPO | Admitting: Nurse Practitioner

## 2019-06-13 ENCOUNTER — Telehealth: Payer: Self-pay | Admitting: Nurse Practitioner

## 2019-06-13 ENCOUNTER — Other Ambulatory Visit: Payer: Self-pay

## 2019-06-13 VITALS — BP 131/78 | HR 76 | Temp 97.4°F | Resp 16 | Ht 67.0 in | Wt 232.0 lb

## 2019-06-13 DIAGNOSIS — N3281 Overactive bladder: Secondary | ICD-10-CM | POA: Diagnosis not present

## 2019-06-13 DIAGNOSIS — E1165 Type 2 diabetes mellitus with hyperglycemia: Secondary | ICD-10-CM | POA: Diagnosis not present

## 2019-06-13 DIAGNOSIS — F411 Generalized anxiety disorder: Secondary | ICD-10-CM

## 2019-06-13 DIAGNOSIS — M5441 Lumbago with sciatica, right side: Secondary | ICD-10-CM | POA: Diagnosis not present

## 2019-06-13 DIAGNOSIS — B009 Herpesviral infection, unspecified: Secondary | ICD-10-CM

## 2019-06-13 DIAGNOSIS — M25562 Pain in left knee: Secondary | ICD-10-CM

## 2019-06-13 DIAGNOSIS — N898 Other specified noninflammatory disorders of vagina: Secondary | ICD-10-CM

## 2019-06-13 LAB — POCT GLYCOSYLATED HEMOGLOBIN (HGB A1C): Hemoglobin A1C: 5.4 % (ref 4.0–5.6)

## 2019-06-13 MED ORDER — VALACYCLOVIR HCL 1 G PO TABS: 1000.0000 mg | ORAL_TABLET | Freq: Two times a day (BID) | ORAL | 2 refills | Status: DC

## 2019-06-13 MED ORDER — DULOXETINE HCL 20 MG PO CPEP: 20.0000 mg | ORAL_CAPSULE | Freq: Every day | ORAL | 1 refills | Status: DC

## 2019-06-13 MED ORDER — LIDOCAINE VISCOUS HCL 2 % MT SOLN: OROMUCOSAL | 2 refills | Status: DC

## 2019-06-13 MED ORDER — HYDROCODONE-ACETAMINOPHEN 5-325 MG PO TABS: 1.0000 | ORAL_TABLET | Freq: Four times a day (QID) | ORAL | 0 refills | Status: DC | PRN

## 2019-06-13 MED ORDER — GABAPENTIN 100 MG PO CAPS: 100.0000 mg | ORAL_CAPSULE | Freq: Three times a day (TID) | ORAL | 1 refills | Status: DC

## 2019-06-13 MED ORDER — OXYBUTYNIN CHLORIDE 5 MG PO TABS: 5.0000 mg | ORAL_TABLET | Freq: Every evening | ORAL | 3 refills | Status: DC | PRN

## 2019-06-13 NOTE — Telephone Encounter (Signed)
Changed quantity of prescription to 28 and resent to pharmacy. Thanks.

## 2019-06-13 NOTE — Progress Notes (Signed)
[facility] [place],  [address]  Internal MEDICINE  Office Visit Note  Patient Name: Jo Watkins  [id number]  [id number]  Date of Service: 06/15/2019  Chief Complaint  Patient presents with  . Follow-up    wants to start back on weight loss  . Diabetes  . Hypothyroidism  . Gastroesophageal Reflux  . Anxiety  . Ulcer    painful mouth ulcers and on her bottom half    The patient is here for routine follow up. She is eporting increased levels of stress and anxiety. She and her husband are trying to buy a house. She is also anxious regarding spread of COVID 19. Due to increased stress and anxiety, she is having outbreak of herpes, both orally and vaginally. She has not had flare in several years. Is using desitin to help relieve pain and provide a barrier between her skin and clothing. This has helped some.       Current Medication: Outpatient Encounter Medications as of 06/13/2019  Medication Sig  . cyclobenzaprine (FLEXERIL) 10 MG tablet TAKE 1 TABLET BY MOUTH TWICE DAILY AS NEEDED FOR BACK PAIN OR SPASMS  . gabapentin (NEURONTIN) 100 MG capsule Take 1 capsule (100 mg total) by mouth 3 (three) times daily.  Marland Kitchen HYDROcodone-acetaminophen (NORCO/VICODIN) 5-325 MG tablet Take 1 tablet by mouth every 6 (six) hours as needed for moderate pain.  Marland Kitchen levothyroxine (SYNTHROID) 112 MCG tablet Take 1 tablet (112 mcg total) by mouth every morning.  . meclizine (ANTIVERT) 25 MG tablet Take 1 tablet (25 mg total) by mouth 3 (three) times daily as needed for dizziness.  . meloxicam (MOBIC) 15 MG tablet Take 1 tablet (15 mg total) by mouth daily.  . metFORMIN (GLUCOPHAGE) 500 MG tablet Take 1 tablet (500 mg total) by mouth 2 (two) times daily with a meal.  . nystatin cream (MYCOSTATIN) APPLY EXTERNALLY TO THE AFFECTED AREA TWICE DAILY AS NEEDED FOR RASH  . oxybutynin (DITROPAN-XL) 10 MG 24 hr tablet Take 1 tablet (10 mg total) by mouth at bedtime.  . pantoprazole  (PROTONIX) 40 MG tablet Take 1 tablet (40 mg total) by mouth daily.  . Phendimetrazine Tartrate 105 MG CP24 Take 1 capsule (105 mg total) by mouth daily.  . [DISCONTINUED] gabapentin (NEURONTIN) 100 MG capsule Take 1 capsule (100 mg total) by mouth 3 (three) times daily.  . [DISCONTINUED] HYDROcodone-acetaminophen (NORCO/VICODIN) 5-325 MG tablet Take 1 tablet by mouth every 6 (six) hours as needed for moderate pain.  . [DISCONTINUED] HYDROcodone-acetaminophen (NORCO/VICODIN) 5-325 MG tablet Take 1 tablet by mouth every 6 (six) hours as needed for moderate pain.  . DULoxetine (CYMBALTA) 20 MG capsule Take 1 capsule (20 mg total) by mouth daily.  Marland Kitchen lidocaine (XYLOCAINE) 2 % solution Apply small amount to affected areas QID prn  . oxybutynin (DITROPAN) 5 MG tablet Take 1 tablet (5 mg total) by mouth at bedtime as needed for bladder spasms.  . valACYclovir (VALTREX) 1000 MG tablet Take 1 tablet (1,000 mg total) by mouth 2 (two) times daily.  . [DISCONTINUED] Cetirizine HCl (ZYRTEC ALLERGY) 10 MG CAPS Take by mouth.  . [DISCONTINUED] mometasone (NASONEX) 50 MCG/ACT nasal spray Place into the nose.   No facility-administered encounter medications on file as of 06/13/2019.     Surgical History: Past Surgical History:  Procedure Laterality Date  . CHOLECYSTECTOMY      Medical History: Past Medical History:  Diagnosis Date  . Hypothyroidism     Family History: Family History  Problem Relation Age of Onset  . Anemia Mother   . Brain cancer Father   . Prostate cancer Brother   . Prostate cancer Maternal Uncle   . Stroke Maternal Grandfather   . Breast cancer Paternal Grandmother   . Skin cancer Paternal Grandfather     Social History   Socioeconomic History  . Marital status: Married    Spouse name: Not on file  . Number of children: Not on file  . Years of education: Not on file  . Highest education level: Not on file  Occupational History  . Not on file  Social Needs  .  Financial resource strain: Not on file  . Food insecurity    Worry: Not on file    Inability: Not on file  . Transportation needs    Medical: Not on file    Non-medical: Not on file  Tobacco Use  . Smoking status: Former Games developermoker  . Smokeless tobacco: Never Used  Substance and Sexual Activity  . Alcohol use: No    Frequency: Never  . Drug use: No  . Sexual activity: Not on file  Lifestyle  . Physical activity    Days per week: Not on file    Minutes per session: Not on file  . Stress: Not on file  Relationships  . Social Musicianconnections    Talks on phone: Not on file    Gets together: Not on file    Attends religious service: Not on file    Active member of club or organization: Not on file    Attends meetings of clubs or organizations: Not on file    Relationship status: Not on file  . Intimate partner violence    Fear of current or ex partner: Not on file    Emotionally abused: Not on file    Physically abused: Not on file    Forced sexual activity: Not on file  Other Topics Concern  . Not on file  Social History Narrative  . Not on file      Review of Systems  Constitutional: Positive for fatigue. Negative for chills and unexpected weight change.  HENT: Negative for congestion, postnasal drip, rhinorrhea, sneezing and sore throat.   Respiratory: Negative for cough, chest tightness and shortness of breath.   Cardiovascular: Negative for chest pain and palpitations.  Gastrointestinal: Negative for abdominal pain, constipation, diarrhea, nausea and vomiting.  Endocrine: Negative for cold intolerance, heat intolerance, polydipsia and polyuria.       Blood sugars doing well   Genitourinary:       Flare of herpes due to increase stress   Musculoskeletal: Negative for arthralgias, back pain, joint swelling and neck pain.  Skin: Negative for rash.       Fever blister   Neurological: Negative for dizziness, tremors, numbness and headaches.  Hematological: Negative for  adenopathy. Does not bruise/bleed easily.  Psychiatric/Behavioral: Negative for behavioral problems (Depression), sleep disturbance and suicidal ideas. The patient is nervous/anxious.     Today's Vitals   06/13/19 1402  BP: 131/78  Pulse: 76  Resp: 16  Temp: (!) 97.4 F (36.3 C)  SpO2: 96%  Weight: 232 lb (105.2 kg)  Height: 5\' 7"  (1.702 m)   Body mass index is 36.34 kg/m.  Physical Exam Vitals signs and nursing note reviewed.  Constitutional:      General: She is not in acute distress.    Appearance: Normal appearance. She is well-developed. She is not diaphoretic.  HENT:  Head: Normocephalic and atraumatic.     Nose: Nose normal.     Mouth/Throat:     Pharynx: No oropharyngeal exudate.  Eyes:     Conjunctiva/sclera: Conjunctivae normal.     Pupils: Pupils are equal, round, and reactive to light.  Neck:     Musculoskeletal: Normal range of motion and neck supple.     Thyroid: No thyromegaly.     Vascular: No carotid bruit or JVD.     Trachea: No tracheal deviation.  Cardiovascular:     Rate and Rhythm: Normal rate and regular rhythm.     Heart sounds: Normal heart sounds. No murmur. No friction rub. No gallop.   Pulmonary:     Effort: Pulmonary effort is normal. No respiratory distress.     Breath sounds: Normal breath sounds. No wheezing or rales.  Chest:     Chest wall: No tenderness.  Abdominal:     General: Bowel sounds are normal.     Palpations: Abdomen is soft.     Tenderness: There is no abdominal tenderness.  Genitourinary:    Comments: There is redness and inflammation present on bilateral labia. Very tender to palpate. Remnants of desitin present.  Musculoskeletal: Normal range of motion.     Comments: Moderate to severe right knee pain. Mor severe with flexion and extension of the knee. Patient wearing a small knee brace to provide support of the knee. Pain is rated at 7/10 in severity.   Lymphadenopathy:     Cervical: No cervical adenopathy.   Skin:    General: Skin is warm and dry.     Capillary Refill: Capillary refill takes less than 2 seconds.  Neurological:     Mental Status: She is alert and oriented to person, place, and time.     Cranial Nerves: No cranial nerve deficit.  Psychiatric:        Behavior: Behavior normal.        Thought Content: Thought content normal.        Judgment: Judgment normal.   Assessment/Plan: 1. Type 2 diabetes mellitus with hyperglycemia, unspecified whether long term insulin use (HCC) - POCT HgB A1C 5.4 today. Continue low dose metformin as prescribed   2. Acute pain of left knee Short-term prescription for hydrocodone/APPA 5/325mg  tablets. May take as needed and as prescribed. New prescription sent to her pharmacy. Reviewed risks and possible side effects associated with taking opiates, benzodiazepines and other CNS depressants. Combination of these could cause dizziness and drowsiness. Advised patient not to drive or operate machinery when taking these medications, as patient's and other's life can be at risk and will have consequences. Patient verbalized understanding in this matter. Dependence and abuse for these drugs will be monitored closely. A Controlled substance policy and procedure is on file which allows [facility] to order a urine drug screen test at any visit. Patient understands and agrees with the plan - HYDROcodone-acetaminophen (NORCO/VICODIN) 5-325 MG tablet; Take 1 tablet by mouth every 6 (six) hours as needed for moderate pain.  Dispense: 28 tablet; Refill: 0  3. Low back pain with right-sided sciatica, unspecified back pain laterality, unspecified chronicity May continue to take gabapentin 100mg  up to three times daily as needed.  - gabapentin (NEURONTIN) 100 MG capsule; Take 1 capsule (100 mg total) by mouth 3 (three) times daily.  Dispense: 270 capsule; Refill: 1  4. Overactive bladder Continue oxybutynin as prescribed.  - oxybutynin (DITROPAN) 5 MG tablet;  Take 1 tablet (5 mg total) by  mouth at bedtime as needed for bladder spasms.  Dispense: 90 tablet; Refill: 3  5. Herpes simplex infection Recent outbreak present. Start valtrex 1000mg  twice daily for 5 days. Refills provided to use as needed.  - valACYclovir (VALTREX) 1000 MG tablet; Take 1 tablet (1,000 mg total) by mouth 2 (two) times daily.  Dispense: 10 tablet; Refill: 2  6. Vaginal lesion Viscous lidocaine may be applied to affected areas as needed and as prescribed  - lidocaine (XYLOCAINE) 2 % solution; Apply small amount to affected areas QID prn  Dispense: 20 mL; Refill: 2  7. Generalized anxiety disorder Start duloxetine 20mg  daily. Reassess at next visit.  - DULoxetine (CYMBALTA) 20 MG capsule; Take 1 capsule (20 mg total) by mouth daily.  Dispense: 90 capsule; Refill: 1  General Counseling: Priscille Kluverlaine verbalizes understanding of the findings of todays visit and agrees with plan of treatment. I have discussed any further diagnostic evaluation that may be needed or ordered today. We also reviewed her medications today. she has been encouraged to call the office with any questions or concerns that should arise related to todays visit.  This patient was seen by Vincent GrosHeather Naviyah Schaffert FNP Collaboration with Dr Lyndon CodeFozia M Khan as a part of collaborative care agreement  Orders Placed This Encounter  Procedures  . POCT HgB A1C    Meds ordered this encounter  Medications  . valACYclovir (VALTREX) 1000 MG tablet    Sig: Take 1 tablet (1,000 mg total) by mouth 2 (two) times daily.    Dispense:  10 tablet    Refill:  2    Order Specific Question:   Supervising Provider    Answer:   Lyndon CodeKHAN, FOZIA M [[id number]]  . lidocaine (XYLOCAINE) 2 % solution    Sig: Apply small amount to affected areas QID prn    Dispense:  20 mL    Refill:  2    Order Specific Question:   Supervising Provider    Answer:   Lyndon CodeKHAN, FOZIA M [[id number]]  . oxybutynin (DITROPAN) 5 MG tablet    Sig: Take 1 tablet (5 mg total) by mouth at  bedtime as needed for bladder spasms.    Dispense:  90 tablet    Refill:  3    Order Specific Question:   Supervising Provider    Answer:   Lyndon CodeKHAN, FOZIA M [[id number]]  . gabapentin (NEURONTIN) 100 MG capsule    Sig: Take 1 capsule (100 mg total) by mouth 3 (three) times daily.    Dispense:  270 capsule    Refill:  1    Order Specific Question:   Supervising Provider    Answer:   Lyndon CodeKHAN, FOZIA M [[id number]]  . DISCONTD: HYDROcodone-acetaminophen (NORCO/VICODIN) 5-325 MG tablet    Sig: Take 1 tablet by mouth every 6 (six) hours as needed for moderate pain.    Dispense:  45 tablet    Refill:  0    Order Specific Question:   Supervising Provider    Answer:   Lyndon CodeKHAN, FOZIA M [[id number]]  . DULoxetine (CYMBALTA) 20 MG capsule    Sig: Take 1 capsule (20 mg total) by mouth daily.    Dispense:  90 capsule    Refill:  1    Order Specific Question:   Supervising Provider    Answer:   Lyndon CodeKHAN, FOZIA M [[id number]]  . HYDROcodone-acetaminophen (NORCO/VICODIN) 5-325 MG tablet    Sig: Take 1 tablet by mouth every 6 (six) hours as needed for moderate pain.    Dispense:  28 tablet    Refill:  0    Order Specific Question:   Supervising Provider    Answer:   Lyndon CodeKHAN, FOZIA M [[id number]]    Time spent 30 Minutes      Dr Lyndon CodeFozia M Khan Internal medicine

## 2019-06-13 NOTE — Telephone Encounter (Signed)
PHARMACY CALLED IN REGARDS TO PAIN MEDICATION THAT WAS CALLED PHARMACY STATES THAT EITHER THE DIRECTIONS NEED TO BE CHANGED OR THE QUANTITY THE LAW ONLY ALLOWS A 7 DAY SUPPLY .

## 2019-06-15 DIAGNOSIS — B009 Herpesviral infection, unspecified: Secondary | ICD-10-CM | POA: Insufficient documentation

## 2019-06-15 DIAGNOSIS — E1165 Type 2 diabetes mellitus with hyperglycemia: Secondary | ICD-10-CM | POA: Insufficient documentation

## 2019-06-15 DIAGNOSIS — N898 Other specified noninflammatory disorders of vagina: Secondary | ICD-10-CM | POA: Insufficient documentation

## 2019-08-26 ENCOUNTER — Other Ambulatory Visit: Payer: BC Managed Care – PPO | Admitting: Nurse Practitioner

## 2019-09-17 ENCOUNTER — Other Ambulatory Visit: Payer: Self-pay

## 2019-09-17 DIAGNOSIS — K219 Gastro-esophageal reflux disease without esophagitis: Secondary | ICD-10-CM

## 2019-09-17 DIAGNOSIS — E039 Hypothyroidism, unspecified: Secondary | ICD-10-CM

## 2019-09-17 DIAGNOSIS — E1165 Type 2 diabetes mellitus with hyperglycemia: Secondary | ICD-10-CM

## 2019-09-17 DIAGNOSIS — M545 Low back pain, unspecified: Secondary | ICD-10-CM

## 2019-09-17 MED ORDER — CYCLOBENZAPRINE HCL 10 MG PO TABS: ORAL_TABLET | ORAL | 1 refills | Status: DC

## 2019-09-17 MED ORDER — LEVOTHYROXINE SODIUM 112 MCG PO TABS: 112.0000 ug | ORAL_TABLET | Freq: Every morning | ORAL | 1 refills | Status: DC

## 2019-09-17 MED ORDER — METFORMIN HCL 500 MG PO TABS: 500.0000 mg | ORAL_TABLET | Freq: Two times a day (BID) | ORAL | 1 refills | Status: DC

## 2019-09-17 MED ORDER — PANTOPRAZOLE SODIUM 40 MG PO TBEC: 40.0000 mg | DELAYED_RELEASE_TABLET | Freq: Every day | ORAL | 1 refills | Status: DC

## 2019-09-19 ENCOUNTER — Other Ambulatory Visit: Payer: Self-pay

## 2019-09-19 DIAGNOSIS — E1165 Type 2 diabetes mellitus with hyperglycemia: Secondary | ICD-10-CM

## 2019-09-19 MED ORDER — METFORMIN HCL 500 MG PO TABS: 500.0000 mg | ORAL_TABLET | Freq: Two times a day (BID) | ORAL | 0 refills | Status: DC

## 2019-10-01 ENCOUNTER — Other Ambulatory Visit: Payer: Self-pay

## 2019-10-01 DIAGNOSIS — M5441 Lumbago with sciatica, right side: Secondary | ICD-10-CM

## 2019-10-01 MED ORDER — MELOXICAM 15 MG PO TABS: 15.0000 mg | ORAL_TABLET | Freq: Every day | ORAL | 0 refills | Status: DC

## 2019-10-13 ENCOUNTER — Ambulatory Visit: Payer: BC Managed Care – PPO | Admitting: Nurse Practitioner

## 2019-10-17 ENCOUNTER — Telehealth: Payer: Self-pay

## 2019-10-17 NOTE — Telephone Encounter (Signed)
CONFIRMED AND SCREENED FOR 10-21-19 OV. 

## 2019-10-21 ENCOUNTER — Other Ambulatory Visit: Payer: Self-pay

## 2019-10-21 ENCOUNTER — Ambulatory Visit: Payer: Self-pay | Admitting: Nurse Practitioner

## 2019-10-21 ENCOUNTER — Encounter: Payer: Self-pay | Admitting: Nurse Practitioner

## 2019-10-21 VITALS — BP 126/78 | HR 78 | Temp 97.6°F | Resp 16 | Ht 67.0 in | Wt 220.0 lb

## 2019-10-21 DIAGNOSIS — E119 Type 2 diabetes mellitus without complications: Secondary | ICD-10-CM

## 2019-10-21 DIAGNOSIS — M5441 Lumbago with sciatica, right side: Secondary | ICD-10-CM

## 2019-10-21 DIAGNOSIS — F411 Generalized anxiety disorder: Secondary | ICD-10-CM

## 2019-10-21 DIAGNOSIS — E669 Obesity, unspecified: Secondary | ICD-10-CM

## 2019-10-21 DIAGNOSIS — N3281 Overactive bladder: Secondary | ICD-10-CM

## 2019-10-21 MED ORDER — PHENTERMINE HCL 37.5 MG PO TABS: 37.5000 mg | ORAL_TABLET | Freq: Every day | ORAL | 1 refills | Status: DC

## 2019-10-21 MED ORDER — MELOXICAM 15 MG PO TABS: 15.0000 mg | ORAL_TABLET | Freq: Every day | ORAL | 1 refills | Status: DC

## 2019-10-21 MED ORDER — DULOXETINE HCL 20 MG PO CPEP: 20.0000 mg | ORAL_CAPSULE | Freq: Every day | ORAL | 1 refills | Status: DC

## 2019-10-21 MED ORDER — OXYBUTYNIN CHLORIDE ER 10 MG PO TB24: 10.0000 mg | ORAL_TABLET | Freq: Two times a day (BID) | ORAL | 1 refills | Status: DC

## 2019-10-21 MED ORDER — METFORMIN HCL 500 MG PO TABS: 500.0000 mg | ORAL_TABLET | Freq: Two times a day (BID) | ORAL | 1 refills | Status: DC

## 2019-10-21 NOTE — Progress Notes (Signed)
[facility] [address] [place], [place] [address]  Internal MEDICINE  Office Visit Note  Patient Name: Jo Watkins  [id number]  [id number]  Date of Service: 10/29/2019  Chief Complaint  Patient presents with  . Medical Management of Chronic Issues    follow up meds, pt has no insurance at this time     The patient is here for follow up visit. Her blood pressure is well controlled.  She states that she has intermittent chest discomfort which radiates to right arm and right side of her neck. Happened a few months ago and then happened again about a week ago. Offered her an ECG. She would like to wait for cardiac work up until she has insurance. This should take effect in January, 2021.  She also reports some left knee pain. Hurts more when she has been sitting for a long period of time. Will feel her knee "pop" out of place and cannot walk until it "pops" back into place.       Current Medication: Outpatient Encounter Medications as of 10/21/2019  Medication Sig  . cyclobenzaprine (FLEXERIL) 10 MG tablet TAKE 1 TABLET BY MOUTH TWICE DAILY AS NEEDED FOR BACK PAIN OR SPASMS  . DULoxetine (CYMBALTA) 20 MG capsule Take 1 capsule (20 mg total) by mouth daily.  Marland Kitchen gabapentin (NEURONTIN) 100 MG capsule Take 1 capsule (100 mg total) by mouth 3 (three) times daily.  Marland Kitchen HYDROcodone-acetaminophen (NORCO/VICODIN) 5-325 MG tablet Take 1 tablet by mouth every 6 (six) hours as needed for moderate pain.  Marland Kitchen levothyroxine (SYNTHROID) 112 MCG tablet Take 1 tablet (112 mcg total) by mouth every morning.  . lidocaine (XYLOCAINE) 2 % solution Apply small amount to affected areas QID prn  . meclizine (ANTIVERT) 25 MG tablet Take 1 tablet (25 mg total) by mouth 3 (three) times daily as needed for dizziness.  . meloxicam (MOBIC) 15 MG tablet Take 1 tablet (15 mg total) by mouth daily.  . metFORMIN (GLUCOPHAGE) 500 MG tablet Take 1 tablet (500 mg total) by mouth 2 (two) times daily with a  meal.  . nystatin cream (MYCOSTATIN) APPLY EXTERNALLY TO THE AFFECTED AREA TWICE DAILY AS NEEDED FOR RASH  . oxybutynin (DITROPAN-XL) 10 MG 24 hr tablet Take 1 tablet (10 mg total) by mouth 2 (two) times daily.  . pantoprazole (PROTONIX) 40 MG tablet Take 1 tablet (40 mg total) by mouth daily.  . valACYclovir (VALTREX) 1000 MG tablet Take 1 tablet (1,000 mg total) by mouth 2 (two) times daily.  . [DISCONTINUED] DULoxetine (CYMBALTA) 20 MG capsule Take 1 capsule (20 mg total) by mouth daily.  . [DISCONTINUED] meloxicam (MOBIC) 15 MG tablet Take 1 tablet (15 mg total) by mouth daily.  . [DISCONTINUED] metFORMIN (GLUCOPHAGE) 500 MG tablet Take 1 tablet (500 mg total) by mouth 2 (two) times daily with a meal.  . [DISCONTINUED] oxybutynin (DITROPAN) 5 MG tablet Take 1 tablet (5 mg total) by mouth at bedtime as needed for bladder spasms.  . [DISCONTINUED] oxybutynin (DITROPAN-XL) 10 MG 24 hr tablet Take 1 tablet (10 mg total) by mouth at bedtime.  . [DISCONTINUED] Phendimetrazine Tartrate 105 MG CP24 Take 1 capsule (105 mg total) by mouth daily.  . [DISCONTINUED] phentermine (ADIPEX-P) 37.5 MG tablet Take 1 tablet (37.5 mg total) by mouth daily before breakfast.   No facility-administered encounter medications on file as of 10/21/2019.    Surgical History: Past Surgical History:  Procedure Laterality Date  . CHOLECYSTECTOMY  Medical History: Past Medical History:  Diagnosis Date  . Hypothyroidism     Family History: Family History  Problem Relation Age of Onset  . Anemia Mother   . Brain cancer Father   . Prostate cancer Brother   . Prostate cancer Maternal Uncle   . Stroke Maternal Grandfather   . Breast cancer Paternal Grandmother   . Skin cancer Paternal Grandfather     Social History   Socioeconomic History  . Marital status: Married    Spouse name: Not on file  . Number of children: Not on file  . Years of education: Not on file  . Highest education level: Not on  file  Occupational History  . Not on file  Tobacco Use  . Smoking status: Former Games developer  . Smokeless tobacco: Never Used  Substance and Sexual Activity  . Alcohol use: No  . Drug use: No  . Sexual activity: Not on file  Other Topics Concern  . Not on file  Social History Narrative  . Not on file   Social Determinants of Health   Financial Resource Strain:   . Difficulty of Paying Living Expenses: Not on file  Food Insecurity:   . Worried About Programme researcher, broadcasting/film/video in the Last Year: Not on file  . Ran Out of Food in the Last Year: Not on file  Transportation Needs:   . Lack of Transportation (Medical): Not on file  . Lack of Transportation (Non-Medical): Not on file  Physical Activity:   . Days of Exercise per Week: Not on file  . Minutes of Exercise per Session: Not on file  Stress:   . Feeling of Stress : Not on file  Social Connections:   . Frequency of Communication with Friends and Family: Not on file  . Frequency of Social Gatherings with Friends and Family: Not on file  . Attends Religious Services: Not on file  . Active Member of Clubs or Organizations: Not on file  . Attends Banker Meetings: Not on file  . Marital Status: Not on file  Intimate Partner Violence:   . Fear of Current or Ex-Partner: Not on file  . Emotionally Abused: Not on file  . Physically Abused: Not on file  . Sexually Abused: Not on file      Review of Systems  Constitutional: Positive for fatigue. Negative for chills and unexpected weight change.       Weight loss of 12 pounds since her last visit  HENT: Negative for congestion, postnasal drip, rhinorrhea, sneezing and sore throat.   Respiratory: Negative for cough, chest tightness and shortness of breath.   Cardiovascular: Positive for chest pain. Negative for palpitations.       Intermittent right sided chest pain with radiation to the arm. Worse when she is stressed.   Gastrointestinal: Negative for abdominal pain,  constipation, diarrhea, nausea and vomiting.  Endocrine: Negative for cold intolerance, heat intolerance, polydipsia and polyuria.       Blood sugars doing well   Musculoskeletal: Positive for arthralgias and joint swelling. Negative for back pain and neck pain.  Skin: Negative for rash.  Allergic/Immunologic: Positive for environmental allergies.  Neurological: Positive for headaches. Negative for dizziness, tremors and numbness.  Hematological: Negative for adenopathy. Does not bruise/bleed easily.  Psychiatric/Behavioral: Positive for sleep disturbance. Negative for behavioral problems (Depression) and suicidal ideas. The patient is nervous/anxious.     Today's Vitals   10/21/19 1442  BP: 126/78  Pulse: 78  Resp: 16  Temp: 97.6 F (36.4 C)  SpO2: 96%  Weight: 220 lb (99.8 kg)  Height: 5\' 7"  (1.702 m)   Body mass index is 34.46 kg/m.  Physical Exam Vitals and nursing note reviewed.  Constitutional:      General: She is not in acute distress.    Appearance: Normal appearance. She is well-developed. She is not diaphoretic.  HENT:     Head: Normocephalic and atraumatic.     Mouth/Throat:     Pharynx: No oropharyngeal exudate.  Eyes:     Conjunctiva/sclera: Conjunctivae normal.     Pupils: Pupils are equal, round, and reactive to light.  Neck:     Thyroid: No thyromegaly.     Vascular: No carotid bruit or JVD.     Trachea: No tracheal deviation.  Cardiovascular:     Rate and Rhythm: Normal rate and regular rhythm.     Heart sounds: Normal heart sounds. No murmur. No friction rub. No gallop.   Pulmonary:     Effort: Pulmonary effort is normal. No respiratory distress.     Breath sounds: Normal breath sounds. No wheezing or rales.  Chest:     Chest wall: No tenderness.  Abdominal:     Palpations: Abdomen is soft.  Musculoskeletal:        General: Normal range of motion.     Cervical back: Normal range of motion and neck supple.     Comments: Moderate to severe right  knee pain. More severe with flexion and extension of the knee. Patient wearing a small knee brace to provide support of the knee. Pain is rated at 7/10 in severity.   Lymphadenopathy:     Cervical: No cervical adenopathy.  Skin:    General: Skin is warm and dry.     Capillary Refill: Capillary refill takes less than 2 seconds.  Neurological:     Mental Status: She is alert and oriented to person, place, and time.     Cranial Nerves: No cranial nerve deficit.  Psychiatric:        Attention and Perception: Attention and perception normal.        Mood and Affect: Affect normal. Mood is anxious.        Speech: Speech normal.        Behavior: Behavior normal.        Thought Content: Thought content normal.        Cognition and Memory: Cognition and memory normal.        Judgment: Judgment normal.    Assessment/Plan: 1. Type 2 diabetes mellitus without complication, without long-term current use of insulin (HCC) Blood sugars doing well in general. Will check HgbA1c at next visit. Continue metformin as prescribed  - metFORMIN (GLUCOPHAGE) 500 MG tablet; Take 1 tablet (500 mg total) by mouth 2 (two) times daily with a meal.  Dispense: 180 tablet; Refill: 1  2. Low back pain with right-sided sciatica, unspecified back pain laterality, unspecified chronicity Meloxicam 15mg  may be taken daily as needed for pain/inflammation.  - meloxicam (MOBIC) 15 MG tablet; Take 1 tablet (15 mg total) by mouth daily.  Dispense: 90 tablet; Refill: 1  3. Overactive bladder Increase oxybutynin XL 10mg  to twice daily. - oxybutynin (DITROPAN-XL) 10 MG 24 hr tablet; Take 1 tablet (10 mg total) by mouth 2 (two) times daily.  Dispense: 180 tablet; Refill: 1  4. Generalized anxiety disorder Continue duloxetine 20mg  daily - DULoxetine (CYMBALTA) 20 MG capsule; Take 1 capsule (20 mg total) by mouth daily.  Dispense: 90 capsule; Refill: 1  5. Mild obesity May take phendimetrazine 105mg  daily. Limit calorie intake to  1200-150 calories per day. Incorporate exercise into daily routine.   General Counseling: Priscille Kluverlaine verbalizes understanding of the findings of todays visit and agrees with plan of treatment. I have discussed any further diagnostic evaluation that may be needed or ordered today. We also reviewed her medications today. she has been encouraged to call the office with any questions or concerns that should arise related to todays visit.   This patient was seen by Vincent GrosHeather Alawna Graybeal FNP Collaboration with Dr Lyndon CodeFozia M Khan as a part of collaborative care agreement  Meds ordered this encounter  Medications  . oxybutynin (DITROPAN-XL) 10 MG 24 hr tablet    Sig: Take 1 tablet (10 mg total) by mouth 2 (two) times daily.    Dispense:  180 tablet    Refill:  1    Order Specific Question:   Supervising Provider    Answer:   Lyndon CodeKHAN, FOZIA M [[id number]]  . DULoxetine (CYMBALTA) 20 MG capsule    Sig: Take 1 capsule (20 mg total) by mouth daily.    Dispense:  90 capsule    Refill:  1    Order Specific Question:   Supervising Provider    Answer:   Lyndon CodeKHAN, FOZIA M [[id number]]  . meloxicam (MOBIC) 15 MG tablet    Sig: Take 1 tablet (15 mg total) by mouth daily.    Dispense:  90 tablet    Refill:  1    Please run with GoodRx program. Patient given goodrx card today.    Order Specific Question:   Supervising Provider    Answer:   Lyndon CodeKHAN, FOZIA M [[id number]]  . metFORMIN (GLUCOPHAGE) 500 MG tablet    Sig: Take 1 tablet (500 mg total) by mouth 2 (two) times daily with a meal.    Dispense:  180 tablet    Refill:  1    Please run with GoodRx card. Patient given GoodRx card today.    Order Specific Question:   Supervising Provider    Answer:   Lyndon CodeKHAN, FOZIA M [[id number]]  . DISCONTD: phentermine (ADIPEX-P) 37.5 MG tablet    Sig: Take 1 tablet (37.5 mg total) by mouth daily before breakfast.    Dispense:  30 tablet    Refill:  1    Order Specific Question:   Supervising Provider    Answer:   Lyndon CodeKHAN, FOZIA M [[id number]]    Time spent: 3225  Minutes      Dr Lyndon CodeFozia M Khan Internal medicine

## 2019-10-27 ENCOUNTER — Telehealth: Payer: Self-pay

## 2019-10-28 ENCOUNTER — Other Ambulatory Visit: Payer: Self-pay | Admitting: Nurse Practitioner

## 2019-10-28 DIAGNOSIS — E669 Obesity, unspecified: Secondary | ICD-10-CM

## 2019-10-28 MED ORDER — PHENTERMINE HCL 37.5 MG PO TABS: 37.5000 mg | ORAL_TABLET | Freq: Every day | ORAL | 1 refills | Status: DC

## 2019-10-28 MED ORDER — PHENDIMETRAZINE TARTRATE ER 105 MG PO CP24: 105.0000 mg | ORAL_CAPSULE | Freq: Every day | ORAL | 1 refills | Status: DC

## 2019-10-28 NOTE — Telephone Encounter (Signed)
Wrong rx was sent in again. Pt needs phendimetrazine 105 mg. Pt does not take phentermine.

## 2019-10-28 NOTE — Telephone Encounter (Signed)
Ok. She had both in her chart. Sent the phendimetrizine SR 105mg  daily to her pharmacy.

## 2019-10-28 NOTE — Progress Notes (Signed)
Ok. She had both in her chart. Sent the phendimetrizine SR 105mg daily to her pharmacy.

## 2019-10-28 NOTE — Telephone Encounter (Signed)
Changed phendimetrazine to phentermine 37.5mg tablets daily and sent new prescription to her pharmacy.  

## 2019-10-28 NOTE — Progress Notes (Signed)
Changed phendimetrazine to phentermine 37.5mg  tablets daily and sent new prescription to her pharmacy.

## 2019-10-29 NOTE — Telephone Encounter (Signed)
Lmom that correct rx was sent to pharmacy.

## 2019-11-03 ENCOUNTER — Other Ambulatory Visit: Payer: Self-pay

## 2019-11-03 DIAGNOSIS — N3281 Overactive bladder: Secondary | ICD-10-CM

## 2019-12-05 ENCOUNTER — Telehealth: Payer: Self-pay

## 2019-12-05 NOTE — Telephone Encounter (Signed)
Patient confirmed virtual visit on 12/09/2019. klh

## 2019-12-09 ENCOUNTER — Ambulatory Visit: Payer: Self-pay | Admitting: Nurse Practitioner

## 2019-12-09 ENCOUNTER — Encounter: Payer: Self-pay | Admitting: Nurse Practitioner

## 2019-12-09 VITALS — Resp 16 | Ht 67.0 in | Wt 211.0 lb

## 2019-12-09 DIAGNOSIS — N3281 Overactive bladder: Secondary | ICD-10-CM

## 2019-12-09 DIAGNOSIS — H539 Unspecified visual disturbance: Secondary | ICD-10-CM

## 2019-12-09 DIAGNOSIS — E669 Obesity, unspecified: Secondary | ICD-10-CM

## 2019-12-09 DIAGNOSIS — E1165 Type 2 diabetes mellitus with hyperglycemia: Secondary | ICD-10-CM

## 2019-12-09 DIAGNOSIS — M1712 Unilateral primary osteoarthritis, left knee: Secondary | ICD-10-CM

## 2019-12-09 MED ORDER — PHENDIMETRAZINE TARTRATE ER 105 MG PO CP24: 105.0000 mg | ORAL_CAPSULE | Freq: Every day | ORAL | 2 refills | Status: DC

## 2019-12-09 MED ORDER — OXYBUTYNIN CHLORIDE ER 10 MG PO TB24: 10.0000 mg | ORAL_TABLET | Freq: Two times a day (BID) | ORAL | 1 refills | Status: AC

## 2019-12-09 NOTE — Progress Notes (Signed)
[facility] [address] [place], [place] [address]  Internal MEDICINE  Telephone Visit  Patient Name: Jo Watkins  [id number]  [id number]  Date of Service: 12/10/2019  I connected with the patient at 4:46pm by webcam  and verified the patients identity using two identifiers.   I discussed the limitations, risks, security and privacy concerns of performing an evaluation and management service by webcam and the availability of in person appointments. I also discussed with the patient that there may be a patient responsible charge related to the service.  The patient expressed understanding and agrees to proceed.    Chief Complaint  Patient presents with  . Telephone Assessment  . Telephone Screen  . Follow-up    may need ECG  . Medication Refill  . Hypothyroidism  . Diabetes    The patient has been contacted via webcam for follow up visit due to concerns for spread of novel coronavirus. The patient states that she is having severe left knee pain. Burning sensation just below the kneecap this is getting worse and worse. Still waiting for her insurance to kick in. She is taking meloxicam daily and will take hydrocodone/APAP as needed for more severe pain. She states that she was wearing a knee brace, but currently is not because the one she has is worn out. Cannot go back to orthopedic provider until insurance kicks in. She has also moved about three hours away and will need to establish care with new provider when possible.  She also states that her vision has been getting worse recently. Looking at computer screen and phone screen, she has noted blurry vision. She does use readers when reading documents and does have glasses which she needs to wear when driving which she does not wear when looking at the computer.  States that increased dose of oxybutinin is working very well. Would like to have a new prescription for this.       Current Medication: Outpatient  Encounter Medications as of 12/09/2019  Medication Sig  . cyclobenzaprine (FLEXERIL) 10 MG tablet TAKE 1 TABLET BY MOUTH TWICE DAILY AS NEEDED FOR BACK PAIN OR SPASMS  . DULoxetine (CYMBALTA) 20 MG capsule Take 1 capsule (20 mg total) by mouth daily.  Marland Kitchen gabapentin (NEURONTIN) 100 MG capsule Take 1 capsule (100 mg total) by mouth 3 (three) times daily.  Marland Kitchen HYDROcodone-acetaminophen (NORCO/VICODIN) 5-325 MG tablet Take 1 tablet by mouth every 6 (six) hours as needed for moderate pain.  Marland Kitchen levothyroxine (SYNTHROID) 112 MCG tablet Take 1 tablet (112 mcg total) by mouth every morning.  . lidocaine (XYLOCAINE) 2 % solution Apply small amount to affected areas QID prn  . meclizine (ANTIVERT) 25 MG tablet Take 1 tablet (25 mg total) by mouth 3 (three) times daily as needed for dizziness.  . meloxicam (MOBIC) 15 MG tablet Take 1 tablet (15 mg total) by mouth daily.  . metFORMIN (GLUCOPHAGE) 500 MG tablet Take 1 tablet (500 mg total) by mouth 2 (two) times daily with a meal.  . nystatin cream (MYCOSTATIN) APPLY EXTERNALLY TO THE AFFECTED AREA TWICE DAILY AS NEEDED FOR RASH  . oxybutynin (DITROPAN-XL) 10 MG 24 hr tablet Take 1 tablet (10 mg total) by mouth 2 (two) times daily.  . pantoprazole (PROTONIX) 40 MG tablet Take 1 tablet (40 mg total) by mouth daily.  . Phendimetrazine Tartrate 105 MG CP24 Take 1 capsule (105 mg total) by mouth daily.  . valACYclovir (VALTREX) 1000 MG tablet Take 1 tablet (1,000 mg  total) by mouth 2 (two) times daily.  . [DISCONTINUED] oxybutynin (DITROPAN-XL) 10 MG 24 hr tablet Take 1 tablet (10 mg total) by mouth 2 (two) times daily.  . [DISCONTINUED] Phendimetrazine Tartrate 105 MG CP24 Take 1 capsule (105 mg total) by mouth daily.   No facility-administered encounter medications on file as of 12/09/2019.    Surgical History: Past Surgical History:  Procedure Laterality Date  . CHOLECYSTECTOMY      Medical History: Past Medical History:  Diagnosis Date  . Hypothyroidism      Family History: Family History  Problem Relation Age of Onset  . Anemia Mother   . Brain cancer Father   . Prostate cancer Brother   . Prostate cancer Maternal Uncle   . Stroke Maternal Grandfather   . Breast cancer Paternal Grandmother   . Skin cancer Paternal Grandfather     Social History   Socioeconomic History  . Marital status: Married    Spouse name: Not on file  . Number of children: Not on file  . Years of education: Not on file  . Highest education level: Not on file  Occupational History  . Not on file  Tobacco Use  . Smoking status: Former Games developer  . Smokeless tobacco: Never Used  Substance and Sexual Activity  . Alcohol use: No  . Drug use: No  . Sexual activity: Not on file  Other Topics Concern  . Not on file  Social History Narrative  . Not on file   Social Determinants of Health   Financial Resource Strain:   . Difficulty of Paying Living Expenses: Not on file  Food Insecurity:   . Worried About Programme researcher, broadcasting/film/video in the Last Year: Not on file  . Ran Out of Food in the Last Year: Not on file  Transportation Needs:   . Lack of Transportation (Medical): Not on file  . Lack of Transportation (Non-Medical): Not on file  Physical Activity:   . Days of Exercise per Week: Not on file  . Minutes of Exercise per Session: Not on file  Stress:   . Feeling of Stress : Not on file  Social Connections:   . Frequency of Communication with Friends and Family: Not on file  . Frequency of Social Gatherings with Friends and Family: Not on file  . Attends Religious Services: Not on file  . Active Member of Clubs or Organizations: Not on file  . Attends Banker Meetings: Not on file  . Marital Status: Not on file  Intimate Partner Violence:   . Fear of Current or Ex-Partner: Not on file  . Emotionally Abused: Not on file  . Physically Abused: Not on file  . Sexually Abused: Not on file      Review of Systems  Constitutional: Positive  for fatigue. Negative for chills and unexpected weight change.       Weight loss of 9 pounds since her last visit  HENT: Negative for congestion, postnasal drip, rhinorrhea, sneezing and sore throat.   Respiratory: Negative for cough, chest tightness, shortness of breath and wheezing.   Cardiovascular: Negative for chest pain and palpitations.  Gastrointestinal: Negative for abdominal pain, constipation, diarrhea, nausea and vomiting.  Endocrine: Negative for cold intolerance, heat intolerance, polydipsia and polyuria.       Blood sugars doing well   Genitourinary: Positive for frequency and urgency.  Musculoskeletal: Positive for arthralgias and joint swelling. Negative for back pain and neck pain.  Left knee pain and inflammation. Getting worse. Has burning sensation along the lower part of the knee cap. Hurts more when standing and walking for long periods of time.   Skin: Negative for rash.  Allergic/Immunologic: Positive for environmental allergies.  Neurological: Positive for headaches. Negative for dizziness, tremors and numbness.  Hematological: Negative for adenopathy. Does not bruise/bleed easily.  Psychiatric/Behavioral: Positive for sleep disturbance. Negative for behavioral problems (Depression) and suicidal ideas. The patient is nervous/anxious.     Today's Vitals   12/09/19 1544  Resp: 16  Weight: 211 lb (95.7 kg)  Height: 5\' 7"  (1.702 m)   Body mass index is 33.05 kg/m.  Observation/Objective:   The patient is alert and oriented. She is pleasant and answers all questions appropriately. Breathing is non-labored. She is in no acute distress at this time.    Assessment/Plan: 1. Overactive bladder Improved. Continue oxybutynin 10mg  twice daily. Refills provided today.  - oxybutynin (DITROPAN-XL) 10 MG 24 hr tablet; Take 1 tablet (10 mg total) by mouth 2 (two) times daily.  Dispense: 180 tablet; Refill: 1  2. Mild obesity Improving. Nine pound weight loss since  last visit. May continue to take bontril SR daily. Limit calorie intake to 1200-1500 calories per day. incoproate exercise into daily routine as tolerated.  - Phendimetrazine Tartrate 105 MG CP24; Take 1 capsule (105 mg total) by mouth daily.  Dispense: 30 capsule; Refill: 2  3. Osteoarthritis of left knee, unspecified osteoarthritis type Continue meloxicam 15mg  daily to reduce pain and inflammation. May use hydrocodone/APAP as needed for severe pain.  4. Type 2 diabetes mellitus with hyperglycemia, without long-term current use of insulin (HCC) Stable. Continue metformin as prescribed   5. Visual disturbance Recommend use of readers when needed. Needs to see optometrist when possible.  General Counseling: kloee ballew understanding of the findings of today's phone visit and agrees with plan of treatment. I have discussed any further diagnostic evaluation that may be needed or ordered today. We also reviewed her medications today. she has been encouraged to call the office with any questions or concerns that should arise related to todays visit.   This patient was seen by [place] with Dr Lavera Guise as a part of collaborative care agreement  Meds ordered this encounter  Medications  . oxybutynin (DITROPAN-XL) 10 MG 24 hr tablet    Sig: Take 1 tablet (10 mg total) by mouth 2 (two) times daily.    Dispense:  180 tablet    Refill:  1    Order Specific Question:   Supervising Provider    Answer:   Lavera Guise [[id number]]  . Phendimetrazine Tartrate 105 MG CP24    Sig: Take 1 capsule (105 mg total) by mouth daily.    Dispense:  30 capsule    Refill:  2    Order Specific Question:   Supervising Provider    Answer:   Lavera Guise [[id number]]    Time spent: 36 Minutes    Dr Lavera Guise Internal medicine

## 2019-12-10 DIAGNOSIS — H539 Unspecified visual disturbance: Secondary | ICD-10-CM | POA: Insufficient documentation

## 2019-12-22 ENCOUNTER — Ambulatory Visit: Payer: Self-pay | Admitting: Nurse Practitioner

## 2019-12-29 ENCOUNTER — Other Ambulatory Visit: Payer: Self-pay

## 2019-12-29 DIAGNOSIS — B009 Herpesviral infection, unspecified: Secondary | ICD-10-CM

## 2019-12-29 MED ORDER — VALACYCLOVIR HCL 1 G PO TABS: 1000.0000 mg | ORAL_TABLET | Freq: Two times a day (BID) | ORAL | 2 refills | Status: DC

## 2019-12-29 NOTE — Telephone Encounter (Signed)
Patient called and refill for valtrax was sent in

## 2019-12-31 ENCOUNTER — Other Ambulatory Visit: Payer: Self-pay

## 2019-12-31 DIAGNOSIS — N898 Other specified noninflammatory disorders of vagina: Secondary | ICD-10-CM

## 2019-12-31 MED ORDER — LIDOCAINE VISCOUS HCL 2 % MT SOLN: OROMUCOSAL | 2 refills | Status: DC

## 2020-01-23 ENCOUNTER — Telehealth: Payer: Self-pay

## 2020-01-23 ENCOUNTER — Other Ambulatory Visit: Payer: Self-pay

## 2020-01-23 DIAGNOSIS — B009 Herpesviral infection, unspecified: Secondary | ICD-10-CM

## 2020-01-23 MED ORDER — VALACYCLOVIR HCL 1 G PO TABS: 1000.0000 mg | ORAL_TABLET | Freq: Two times a day (BID) | ORAL | 2 refills | Status: DC

## 2020-01-23 NOTE — Telephone Encounter (Signed)
Pt called that need discuss thyroid med advised need to been seen

## 2020-01-23 NOTE — Telephone Encounter (Signed)
Confirmed virtual visit on 01/26/2020. klh 

## 2020-01-26 ENCOUNTER — Ambulatory Visit: Payer: Self-pay | Admitting: Nurse Practitioner

## 2020-01-26 ENCOUNTER — Encounter: Payer: Self-pay | Admitting: Nurse Practitioner

## 2020-01-26 ENCOUNTER — Other Ambulatory Visit: Payer: Self-pay

## 2020-01-26 VITALS — Ht 67.0 in | Wt 216.0 lb

## 2020-01-26 DIAGNOSIS — E1165 Type 2 diabetes mellitus with hyperglycemia: Secondary | ICD-10-CM

## 2020-01-26 DIAGNOSIS — R5383 Other fatigue: Secondary | ICD-10-CM

## 2020-01-26 DIAGNOSIS — E039 Hypothyroidism, unspecified: Secondary | ICD-10-CM

## 2020-01-26 MED ORDER — LEVOTHYROXINE SODIUM 125 MCG PO TABS: 125.0000 ug | ORAL_TABLET | Freq: Every morning | ORAL | 3 refills | Status: DC

## 2020-01-26 NOTE — Progress Notes (Signed)
[facility] [address] [place], [place] [address]  Internal MEDICINE  Telephone Visit  Patient Name: Jo Watkins  [id number]  [id number]  Date of Service: 01/26/2020  I connected with the patient at 8:40am by webcam and verified the patients identity using two identifiers.   I discussed the limitations, risks, security and privacy concerns of performing an evaluation and management service by webcam and the availability of in person appointments. I also discussed with the patient that there may be a patient responsible charge related to the service.  The patient expressed understanding and agrees to proceed.    Chief Complaint  Patient presents with  . Telephone Assessment  . Telephone Screen  . Weight Gain  . Fatigue  . Hypothyroidism    believes thyroid is acting up   . Dizziness    only a little bit   . Depression  . Sinusitis  . Cough    dry cough     The patient has been contacted via webcam for follow up visit due to concerns for spread of novel coronavirus. She presents for acute visit. She believes she is having trouble with her thyroid medication. She states that she has had fatigue, a little bit of dizziness. She has noted increased depression. States that these symptoms have been like this in the past when she has been hypothyroid in the past she is currently on levothyroxine daily. She is unable to get thyroid panel checked as she is still without health insurance.       Current Medication: Outpatient Encounter Medications as of 01/26/2020  Medication Sig  . cyclobenzaprine (FLEXERIL) 10 MG tablet TAKE 1 TABLET BY MOUTH TWICE DAILY AS NEEDED FOR BACK PAIN OR SPASMS  . DULoxetine (CYMBALTA) 20 MG capsule Take 1 capsule (20 mg total) by mouth daily.  Marland Kitchen gabapentin (NEURONTIN) 100 MG capsule Take 1 capsule (100 mg total) by mouth 3 (three) times daily.  Marland Kitchen HYDROcodone-acetaminophen (NORCO/VICODIN) 5-325 MG tablet Take 1 tablet by mouth every 6  (six) hours as needed for moderate pain.  Marland Kitchen levothyroxine (SYNTHROID) 112 MCG tablet Take 1 tablet (112 mcg total) by mouth every morning.  . lidocaine (XYLOCAINE) 2 % solution Apply small amount to affected areas QID prn  . meclizine (ANTIVERT) 25 MG tablet Take 1 tablet (25 mg total) by mouth 3 (three) times daily as needed for dizziness.  . meloxicam (MOBIC) 15 MG tablet Take 1 tablet (15 mg total) by mouth daily.  . metFORMIN (GLUCOPHAGE) 500 MG tablet Take 1 tablet (500 mg total) by mouth 2 (two) times daily with a meal.  . nystatin cream (MYCOSTATIN) APPLY EXTERNALLY TO THE AFFECTED AREA TWICE DAILY AS NEEDED FOR RASH  . oxybutynin (DITROPAN-XL) 10 MG 24 hr tablet Take 1 tablet (10 mg total) by mouth 2 (two) times daily.  . pantoprazole (PROTONIX) 40 MG tablet Take 1 tablet (40 mg total) by mouth daily.  . Phendimetrazine Tartrate 105 MG CP24 Take 1 capsule (105 mg total) by mouth daily.  . valACYclovir (VALTREX) 1000 MG tablet Take 1 tablet (1,000 mg total) by mouth 2 (two) times daily.   No facility-administered encounter medications on file as of 01/26/2020.    Surgical History: Past Surgical History:  Procedure Laterality Date  . CHOLECYSTECTOMY      Medical History: Past Medical History:  Diagnosis Date  . Hypothyroidism     Family History: Family History  Problem Relation Age of Onset  . Anemia Mother   .  Brain cancer Father   . Prostate cancer Brother   . Prostate cancer Maternal Uncle   . Stroke Maternal Grandfather   . Breast cancer Paternal Grandmother   . Skin cancer Paternal Grandfather     Social History   Socioeconomic History  . Marital status: Married    Spouse name: Not on file  . Number of children: Not on file  . Years of education: Not on file  . Highest education level: Not on file  Occupational History  . Not on file  Tobacco Use  . Smoking status: Former Games developer  . Smokeless tobacco: Never Used  Substance and Sexual Activity  . Alcohol  use: No  . Drug use: No  . Sexual activity: Not on file  Other Topics Concern  . Not on file  Social History Narrative  . Not on file   Social Determinants of Health   Financial Resource Strain:   . Difficulty of Paying Living Expenses:   Food Insecurity:   . Worried About Programme researcher, broadcasting/film/video in the Last Year:   . Barista in the Last Year:   Transportation Needs:   . Freight forwarder (Medical):   Marland Kitchen Lack of Transportation (Non-Medical):   Physical Activity:   . Days of Exercise per Week:   . Minutes of Exercise per Session:   Stress:   . Feeling of Stress :   Social Connections:   . Frequency of Communication with Friends and Family:   . Frequency of Social Gatherings with Friends and Family:   . Attends Religious Services:   . Active Member of Clubs or Organizations:   . Attends Banker Meetings:   Marland Kitchen Marital Status:   Intimate Partner Violence:   . Fear of Current or Ex-Partner:   . Emotionally Abused:   Marland Kitchen Physically Abused:   . Sexually Abused:       Review of Systems  Constitutional: Positive for fatigue. Negative for activity change, chills and unexpected weight change.  HENT: Negative for congestion, postnasal drip, rhinorrhea, sneezing and sore throat.   Respiratory: Negative for cough, chest tightness, shortness of breath and wheezing.   Cardiovascular: Negative for chest pain and palpitations.  Gastrointestinal: Negative for abdominal pain, constipation, diarrhea, nausea and vomiting.  Endocrine: Negative for cold intolerance, heat intolerance, polydipsia and polyuria.       Patient believes she may be hypothyroid on current dose of levothyroxine .  Musculoskeletal: Negative for arthralgias, back pain, joint swelling and neck pain.  Skin: Negative for rash.  Allergic/Immunologic: Negative for environmental allergies.  Neurological: Positive for dizziness. Negative for tremors, numbness and headaches.  Hematological: Negative for  adenopathy. Does not bruise/bleed easily.  Psychiatric/Behavioral: Positive for dysphoric mood. Negative for behavioral problems (Depression), sleep disturbance and suicidal ideas.   Observation/Objective:   The patient is alert and oriented. She is pleasant and answers all questions appropriately. Breathing is non-labored. She is in no acute distress at this time.    Assessment/Plan: 1. Other fatigue Likely related to decreasing thyroid levels.   2. Acquired hypothyroidism Increase dose of levothyroxine to daily. Will check thyroid panel prior to next visit and adjust dosing as indicated  - levothyroxine (SYNTHROID) 125 MCG tablet; Take 1 tablet (125 mcg total) by mouth every morning.  Dispense: 30 tablet; Refill: 3  3. Type 2 diabetes mellitus with hyperglycemia, without long-term current use of insulin (HCC) conitnue diabetic medication as prescribed. Check blood sugars daily. Adjust metformin dosing as  indicated.   General Counseling: rynlee lisbon understanding of the findings of today's phone visit and agrees with plan of treatment. I have discussed any further diagnostic evaluation that may be needed or ordered today. We also reviewed her medications today. she has been encouraged to call the office with any questions or concerns that should arise related to todays visit.   This patient was seen by Leretha Pol FNP Collaboration with Dr Lavera Guise as a part of collaborative care agreement  Time spent: 67 Minutes    Dr Lavera Guise Internal medicine

## 2020-03-01 ENCOUNTER — Other Ambulatory Visit: Payer: Self-pay

## 2020-03-01 DIAGNOSIS — B009 Herpesviral infection, unspecified: Secondary | ICD-10-CM

## 2020-03-01 MED ORDER — VALACYCLOVIR HCL 1 G PO TABS: 1000.0000 mg | ORAL_TABLET | Freq: Two times a day (BID) | ORAL | 2 refills | Status: DC

## 2020-03-10 ENCOUNTER — Telehealth: Payer: Self-pay

## 2020-03-10 NOTE — Telephone Encounter (Signed)
Confirmed and screened for 03-15-20 ov. 

## 2020-03-15 ENCOUNTER — Encounter: Payer: Self-pay | Admitting: Nurse Practitioner

## 2020-03-16 ENCOUNTER — Other Ambulatory Visit: Payer: Self-pay

## 2020-03-16 DIAGNOSIS — B009 Herpesviral infection, unspecified: Secondary | ICD-10-CM

## 2020-03-16 MED ORDER — VALACYCLOVIR HCL 1 G PO TABS: 1000.0000 mg | ORAL_TABLET | Freq: Two times a day (BID) | ORAL | 2 refills | Status: DC

## 2020-04-08 ENCOUNTER — Encounter: Payer: Self-pay | Admitting: Nurse Practitioner

## 2020-04-21 ENCOUNTER — Other Ambulatory Visit: Payer: Self-pay

## 2020-04-21 ENCOUNTER — Ambulatory Visit: Payer: Self-pay | Admitting: Adult Health

## 2020-04-21 ENCOUNTER — Encounter: Payer: Self-pay | Admitting: Adult Health

## 2020-04-21 ENCOUNTER — Ambulatory Visit
Admission: RE | Admit: 2020-04-21 | Discharge: 2020-04-21 | Disposition: A | Discharge status: Home / Self Care | Payer: Self-pay | Source: Ambulatory Visit | Attending: Adult Health | Admitting: Adult Health

## 2020-04-21 ENCOUNTER — Ambulatory Visit
Admission: RE | Admit: 2020-04-21 | Discharge: 2020-04-21 | Disposition: A | Discharge status: Home / Self Care | Payer: Self-pay | Attending: Adult Health | Admitting: Adult Health

## 2020-04-21 VITALS — BP 142/77 | HR 86 | Temp 98.0°F | Resp 16 | Ht 67.0 in | Wt 231.2 lb

## 2020-04-21 DIAGNOSIS — M1712 Unilateral primary osteoarthritis, left knee: Secondary | ICD-10-CM

## 2020-04-21 DIAGNOSIS — E1165 Type 2 diabetes mellitus with hyperglycemia: Secondary | ICD-10-CM

## 2020-04-21 DIAGNOSIS — N898 Other specified noninflammatory disorders of vagina: Secondary | ICD-10-CM

## 2020-04-21 DIAGNOSIS — M25521 Pain in right elbow: Secondary | ICD-10-CM

## 2020-04-21 DIAGNOSIS — M25562 Pain in left knee: Secondary | ICD-10-CM

## 2020-04-21 DIAGNOSIS — B009 Herpesviral infection, unspecified: Secondary | ICD-10-CM

## 2020-04-21 DIAGNOSIS — Z6836 Body mass index (BMI) 36.0-36.9, adult: Secondary | ICD-10-CM

## 2020-04-21 DIAGNOSIS — E039 Hypothyroidism, unspecified: Secondary | ICD-10-CM

## 2020-04-21 MED ORDER — HYDROCODONE-ACETAMINOPHEN 5-325 MG PO TABS: 1.0000 | ORAL_TABLET | Freq: Four times a day (QID) | ORAL | 0 refills | Status: AC | PRN

## 2020-04-21 MED ORDER — VALACYCLOVIR HCL 1 G PO TABS: 1000.0000 mg | ORAL_TABLET | Freq: Every day | ORAL | 1 refills | Status: DC

## 2020-04-21 MED ORDER — LIDOCAINE VISCOUS HCL 2 % MT SOLN: OROMUCOSAL | 2 refills | Status: AC

## 2020-04-21 NOTE — Progress Notes (Signed)
[facility] [address] [place], [place] [address]  Internal MEDICINE  Office Visit Note  Patient Name: Jo Watkins  [id number]  [id number]  Date of Service: 04/21/2020  Chief Complaint  Patient presents with  . Acute Visit    fell twice left knee numb and right arm pain  . Fatigue    dizzy spells just by standing up, no energy to do anything      HPI Pt is here for a sick visit.  Pt reports 3 months ago she was pumping gas and got tangled up in the gas line.  She fell and landed her knees and arms.  She continues to have left knee pain, and right elbow weakness/pain.  She also reports over the past month she had 4 episodes of dizziness when she is just walking around the house. They last 10-15 seconds.      Current Medication:  Outpatient Encounter Medications as of 04/21/2020  Medication Sig  . cyclobenzaprine (FLEXERIL) 10 MG tablet TAKE 1 TABLET BY MOUTH TWICE DAILY AS NEEDED FOR BACK PAIN OR SPASMS  . DULoxetine (CYMBALTA) 20 MG capsule Take 1 capsule (20 mg total) by mouth daily.  Marland Kitchen gabapentin (NEURONTIN) 100 MG capsule Take 1 capsule (100 mg total) by mouth 3 (three) times daily.  Marland Kitchen HYDROcodone-acetaminophen (NORCO/VICODIN) 5-325 MG tablet Take 1 tablet by mouth every 6 (six) hours as needed for moderate pain.  Marland Kitchen levothyroxine (SYNTHROID) 125 MCG tablet Take 1 tablet (125 mcg total) by mouth every morning.  . lidocaine (XYLOCAINE) 2 % solution Apply small amount to affected areas QID prn  . meclizine (ANTIVERT) 25 MG tablet Take 1 tablet (25 mg total) by mouth 3 (three) times daily as needed for dizziness.  . meloxicam (MOBIC) 15 MG tablet Take 1 tablet (15 mg total) by mouth daily.  . metFORMIN (GLUCOPHAGE) 500 MG tablet Take 1 tablet (500 mg total) by mouth 2 (two) times daily with a meal.  . nystatin cream (MYCOSTATIN) APPLY EXTERNALLY TO THE AFFECTED AREA TWICE DAILY AS NEEDED FOR RASH  . oxybutynin (DITROPAN-XL) 10 MG 24 hr tablet Take 1 tablet (10  mg total) by mouth 2 (two) times daily.  . pantoprazole (PROTONIX) 40 MG tablet Take 1 tablet (40 mg total) by mouth daily.  . Phendimetrazine Tartrate 105 MG CP24 Take 1 capsule (105 mg total) by mouth daily.  . valACYclovir (VALTREX) 1000 MG tablet Take 1 tablet (1,000 mg total) by mouth 2 (two) times daily.   No facility-administered encounter medications on file as of 04/21/2020.      Medical History: Past Medical History:  Diagnosis Date  . Hypothyroidism      Vital Signs: BP (!) 142/77   Pulse 86   Temp 98 F (36.7 C)   Resp 16   Ht 5\' 7"  (1.702 m)   Wt 231 lb 3.2 oz (104.9 kg)   SpO2 95%   BMI 36.21 kg/m    Review of Systems  Constitutional: Negative for chills, fatigue and unexpected weight change.  HENT: Negative for congestion, rhinorrhea, sneezing and sore throat.   Eyes: Negative for photophobia, pain and redness.  Respiratory: Negative for cough, chest tightness and shortness of breath.   Cardiovascular: Negative for chest pain and palpitations.  Gastrointestinal: Negative for abdominal pain, constipation, diarrhea, nausea and vomiting.  Endocrine: Negative.   Genitourinary: Negative for dysuria and frequency.  Musculoskeletal: Negative for arthralgias, back pain, joint swelling and neck pain.  Skin: Negative for rash.  Allergic/Immunologic:  Negative.   Neurological: Negative for tremors and numbness.  Hematological: Negative for adenopathy. Does not bruise/bleed easily.  Psychiatric/Behavioral: Negative for behavioral problems and sleep disturbance. The patient is not nervous/anxious.     Physical Exam Vitals and nursing note reviewed.  Constitutional:      General: She is not in acute distress.    Appearance: She is well-developed. She is not diaphoretic.  HENT:     Head: Normocephalic and atraumatic.     Mouth/Throat:     Pharynx: No oropharyngeal exudate.  Eyes:     Pupils: Pupils are equal, round, and reactive to light.  Neck:     Thyroid:  No thyromegaly.     Vascular: No JVD.     Trachea: No tracheal deviation.  Cardiovascular:     Rate and Rhythm: Normal rate and regular rhythm.     Heart sounds: Normal heart sounds. No murmur heard.  No friction rub. No gallop.   Pulmonary:     Effort: Pulmonary effort is normal. No respiratory distress.     Breath sounds: Normal breath sounds. No wheezing or rales.  Chest:     Chest wall: No tenderness.  Abdominal:     Palpations: Abdomen is soft.     Tenderness: There is no abdominal tenderness. There is no guarding.  Musculoskeletal:        General: Normal range of motion.     Cervical back: Normal range of motion and neck supple.  Lymphadenopathy:     Cervical: No cervical adenopathy.  Skin:    General: Skin is warm and dry.  Neurological:     Mental Status: She is alert and oriented to person, place, and time.     Cranial Nerves: No cranial nerve deficit.  Psychiatric:        Behavior: Behavior normal.        Thought Content: Thought content normal.        Judgment: Judgment normal.    Assessment/Plan: 1. Right elbow pain Have X-ray completed of Right elbow.  - DG Elbow Complete Right; Future  2. Acquired hypothyroidism Patient given Indigent lab slip to have TSH and FT4  Evaluated.   3. Type 2 diabetes mellitus with hyperglycemia, without long-term current use of insulin (HCC) Previously well controlled.   4. BMI 36.0-36.9,adult Comorbidities include  DM, osteoarthritis and hypothyroid.   5. Osteoarthritis of left knee, unspecified osteoarthritis type Chronic knee pain  6. Herpes simplex infection Will start suppressive therapy of 1000mg  daily to pharmacy.  Encouraged patient to take daily for 6 months.  - valACYclovir (VALTREX) 1000 MG tablet; Take 1 tablet (1,000 mg total) by mouth daily.  Dispense: 90 tablet; Refill: 1 - lidocaine (XYLOCAINE) 2 % solution; Apply small amount to affected areas QID prn  Dispense: 100 mL; Refill: 2  7. Vaginal  lesion Refilled Lidocaine, no lesion currently, she does have intermittent lesions.  - lidocaine (XYLOCAINE) 2 % solution; Apply small amount to affected areas QID prn  Dispense: 100 mL; Refill: 2  8. Acute pain of left knee Reviewed risks and possible side effects associated with taking opiates, benzodiazepines and other CNS depressants. Combination of these could cause dizziness and drowsiness. Advised patient not to drive or operate machinery when taking these medications, as patient's and other's life can be at risk and will have consequences. Patient verbalized understanding in this matter. Dependence and abuse for these drugs will be monitored closely. A Controlled substance policy and procedure is on file which allows [facility] to order a urine drug screen test at any visit. Patient understands and agrees with the plan - HYDROcodone-acetaminophen (NORCO/VICODIN) 5-325 MG tablet; Take 1 tablet by mouth every 6 (six) hours as needed for moderate pain.  Dispense: 28 tablet; Refill: 0  General Counseling: shyna duignan understanding of the findings of todays visit and agrees with plan of treatment. I have discussed any further diagnostic evaluation that may be needed or ordered today. We also reviewed her medications today. she has been encouraged to call the office with any questions or concerns that should arise related to todays visit.   Orders Placed This Encounter  Procedures  . DG Elbow Complete Right    No orders of the defined types were placed in this encounter.   Time spent: 30 Minutes  This patient was seen by Orson Gear AGNP-C in Collaboration with Dr Lavera Guise as a part of collaborative care agreement.  Kendell Bane AGNP-C Internal Medicine

## 2020-04-27 ENCOUNTER — Telehealth: Payer: Self-pay

## 2020-04-27 NOTE — Telephone Encounter (Signed)
As per Jo Watkins pt  advised xray was normal and also iron is low take OTC iron med and vitamin is borderline low she take OTC vitamin D and take OTC stool softener if constipation

## 2020-06-29 ENCOUNTER — Other Ambulatory Visit: Payer: Self-pay

## 2020-06-29 DIAGNOSIS — F411 Generalized anxiety disorder: Secondary | ICD-10-CM

## 2020-06-29 DIAGNOSIS — K219 Gastro-esophageal reflux disease without esophagitis: Secondary | ICD-10-CM

## 2020-06-29 MED ORDER — PANTOPRAZOLE SODIUM 40 MG PO TBEC: 40.0000 mg | DELAYED_RELEASE_TABLET | Freq: Every day | ORAL | 1 refills | Status: DC

## 2020-06-29 MED ORDER — DULOXETINE HCL 20 MG PO CPEP: 20.0000 mg | ORAL_CAPSULE | Freq: Every day | ORAL | 1 refills | Status: DC

## 2020-08-02 ENCOUNTER — Other Ambulatory Visit: Payer: Self-pay

## 2020-08-02 DIAGNOSIS — E039 Hypothyroidism, unspecified: Secondary | ICD-10-CM

## 2020-08-02 MED ORDER — LEVOTHYROXINE SODIUM 125 MCG PO TABS: 125.0000 ug | ORAL_TABLET | Freq: Every morning | ORAL | 1 refills | Status: DC

## 2020-08-26 ENCOUNTER — Ambulatory Visit: Payer: Self-pay | Admitting: Hospice and Palliative Medicine

## 2020-09-13 ENCOUNTER — Ambulatory Visit
Admission: RE | Admit: 2020-09-13 | Discharge: 2020-09-13 | Disposition: A | Discharge status: Home / Self Care | Payer: BLUE CROSS/BLUE SHIELD | Source: Ambulatory Visit | Attending: Nurse Practitioner | Admitting: Nurse Practitioner

## 2020-09-13 ENCOUNTER — Other Ambulatory Visit: Payer: Self-pay

## 2020-09-13 ENCOUNTER — Ambulatory Visit
Admission: RE | Admit: 2020-09-13 | Discharge: 2020-09-13 | Disposition: A | Discharge status: Home / Self Care | Payer: BLUE CROSS/BLUE SHIELD | Attending: Nurse Practitioner | Admitting: Nurse Practitioner

## 2020-09-13 ENCOUNTER — Ambulatory Visit: Payer: BLUE CROSS/BLUE SHIELD | Admitting: Nurse Practitioner

## 2020-09-13 ENCOUNTER — Encounter: Payer: Self-pay | Admitting: Nurse Practitioner

## 2020-09-13 VITALS — BP 137/70 | HR 65 | Temp 98.1°F | Resp 16 | Ht 67.0 in | Wt 232.6 lb

## 2020-09-13 DIAGNOSIS — B009 Herpesviral infection, unspecified: Secondary | ICD-10-CM

## 2020-09-13 DIAGNOSIS — M25562 Pain in left knee: Secondary | ICD-10-CM | POA: Insufficient documentation

## 2020-09-13 DIAGNOSIS — K219 Gastro-esophageal reflux disease without esophagitis: Secondary | ICD-10-CM

## 2020-09-13 DIAGNOSIS — G8929 Other chronic pain: Secondary | ICD-10-CM

## 2020-09-13 DIAGNOSIS — M1712 Unilateral primary osteoarthritis, left knee: Secondary | ICD-10-CM | POA: Diagnosis not present

## 2020-09-13 DIAGNOSIS — R079 Chest pain, unspecified: Secondary | ICD-10-CM | POA: Diagnosis not present

## 2020-09-13 DIAGNOSIS — Z23 Encounter for immunization: Secondary | ICD-10-CM

## 2020-09-13 DIAGNOSIS — M25561 Pain in right knee: Secondary | ICD-10-CM | POA: Insufficient documentation

## 2020-09-13 DIAGNOSIS — F411 Generalized anxiety disorder: Secondary | ICD-10-CM

## 2020-09-13 DIAGNOSIS — M545 Low back pain, unspecified: Secondary | ICD-10-CM

## 2020-09-13 DIAGNOSIS — E1165 Type 2 diabetes mellitus with hyperglycemia: Secondary | ICD-10-CM | POA: Diagnosis not present

## 2020-09-13 DIAGNOSIS — M5441 Lumbago with sciatica, right side: Secondary | ICD-10-CM | POA: Diagnosis not present

## 2020-09-13 DIAGNOSIS — E039 Hypothyroidism, unspecified: Secondary | ICD-10-CM

## 2020-09-13 DIAGNOSIS — M1711 Unilateral primary osteoarthritis, right knee: Secondary | ICD-10-CM | POA: Diagnosis not present

## 2020-09-13 DIAGNOSIS — E119 Type 2 diabetes mellitus without complications: Secondary | ICD-10-CM

## 2020-09-13 LAB — POCT UA - MICROALBUMIN
Albumin/Creatinine Ratio, Urine, POC: <30
Creatinine, POC: 50 mg/dL
Microalbumin Ur, POC: 10 mg/L

## 2020-09-13 LAB — POCT GLYCOSYLATED HEMOGLOBIN (HGB A1C): Hemoglobin A1C: 5.4 % (ref 4.0–5.6)

## 2020-09-13 MED ORDER — METFORMIN HCL 500 MG PO TABS: 500.0000 mg | ORAL_TABLET | Freq: Two times a day (BID) | ORAL | 1 refills | Status: AC

## 2020-09-13 MED ORDER — PANTOPRAZOLE SODIUM 40 MG PO TBEC: 40.0000 mg | DELAYED_RELEASE_TABLET | Freq: Every day | ORAL | 1 refills | Status: AC

## 2020-09-13 MED ORDER — DULOXETINE HCL 20 MG PO CPEP: 20.0000 mg | ORAL_CAPSULE | Freq: Every day | ORAL | 1 refills | Status: AC

## 2020-09-13 MED ORDER — CYCLOBENZAPRINE HCL 10 MG PO TABS: ORAL_TABLET | ORAL | 1 refills | Status: AC

## 2020-09-13 MED ORDER — VALACYCLOVIR HCL 1 G PO TABS: 1000.0000 mg | ORAL_TABLET | Freq: Every day | ORAL | 1 refills | Status: AC

## 2020-09-13 MED ORDER — LEVOTHYROXINE SODIUM 125 MCG PO TABS: 125.0000 ug | ORAL_TABLET | Freq: Every morning | ORAL | 1 refills | Status: AC

## 2020-09-13 MED ORDER — MELOXICAM 15 MG PO TABS: 15.0000 mg | ORAL_TABLET | Freq: Every day | ORAL | 1 refills | Status: AC

## 2020-09-13 MED ORDER — GABAPENTIN 100 MG PO CAPS: 100.0000 mg | ORAL_CAPSULE | Freq: Three times a day (TID) | ORAL | 1 refills | Status: AC

## 2020-09-13 NOTE — Progress Notes (Signed)
[facility] [address] [place], [place] [address]  Internal MEDICINE  Office Visit Note  Patient Name: Jo Watkins  [id number]  [id number]  Date of Service: 10/10/2020  Chief Complaint  Patient presents with  . Follow-up    chest,arm, jaw hurts sometimes,if she drinks soda it helps  . Quality Metric Gaps    tenaus,covid,eye,foot,  . controlled substance from    reviewed with PT  . Dizziness    The patient is here for follow up visit. Multiple complaints today.  . Pain in left knee getting worse. Pain now in right knee also. Had x-ray done of right knee in 2019 which showed marked degenerative disease in tibulofibular joint and mild degenerative joint disease in patellofemoral joint space.  -chest discomfort. Unclear what makes this worse. Gets better after drinking carbonated drinks. Has associated jaw pain and tightness.  -diabetic. Due to have check of HgbA1c. This is 5.4 today. She also needs to have check of urine microalbumin.  -fatigue and weight gain. Needs to have labs checked, including thyroid and anemia panels.       Current Medication: Outpatient Encounter Medications as of 09/13/2020  Medication Sig  . cyclobenzaprine (FLEXERIL) 10 MG tablet TAKE 1 TABLET BY MOUTH TWICE DAILY AS NEEDED FOR BACK PAIN OR SPASMS  . DULoxetine (CYMBALTA) 20 MG capsule Take 1 capsule (20 mg total) by mouth daily.  Marland Kitchen gabapentin (NEURONTIN) 100 MG capsule Take 1 capsule (100 mg total) by mouth 3 (three) times daily.  Marland Kitchen HYDROcodone-acetaminophen (NORCO/VICODIN) 5-325 MG tablet Take 1 tablet by mouth every 6 (six) hours as needed for moderate pain.  Marland Kitchen levothyroxine (SYNTHROID) 125 MCG tablet Take 1 tablet (125 mcg total) by mouth every morning.  . lidocaine (XYLOCAINE) 2 % solution Apply small amount to affected areas QID prn  . meclizine (ANTIVERT) 25 MG tablet Take 1 tablet (25 mg total) by mouth 3 (three) times daily as needed for dizziness.  . meloxicam (MOBIC) 15  MG tablet Take 1 tablet (15 mg total) by mouth daily.  . metFORMIN (GLUCOPHAGE) 500 MG tablet Take 1 tablet (500 mg total) by mouth 2 (two) times daily with a meal.  . nystatin cream (MYCOSTATIN) APPLY EXTERNALLY TO THE AFFECTED AREA TWICE DAILY AS NEEDED FOR RASH  . oxybutynin (DITROPAN-XL) 10 MG 24 hr tablet Take 1 tablet (10 mg total) by mouth 2 (two) times daily.  . pantoprazole (PROTONIX) 40 MG tablet Take 1 tablet (40 mg total) by mouth daily.  . valACYclovir (VALTREX) 1000 MG tablet Take 1 tablet (1,000 mg total) by mouth daily.  . [DISCONTINUED] cyclobenzaprine (FLEXERIL) 10 MG tablet TAKE 1 TABLET BY MOUTH TWICE DAILY AS NEEDED FOR BACK PAIN OR SPASMS  . [DISCONTINUED] DULoxetine (CYMBALTA) 20 MG capsule Take 1 capsule (20 mg total) by mouth daily.  . [DISCONTINUED] gabapentin (NEURONTIN) 100 MG capsule Take 1 capsule (100 mg total) by mouth 3 (three) times daily.  . [DISCONTINUED] levothyroxine (SYNTHROID) 125 MCG tablet Take 1 tablet (125 mcg total) by mouth every morning.  . [DISCONTINUED] meloxicam (MOBIC) 15 MG tablet Take 1 tablet (15 mg total) by mouth daily.  . [DISCONTINUED] metFORMIN (GLUCOPHAGE) 500 MG tablet Take 1 tablet (500 mg total) by mouth 2 (two) times daily with a meal.  . [DISCONTINUED] pantoprazole (PROTONIX) 40 MG tablet Take 1 tablet (40 mg total) by mouth daily.  . [DISCONTINUED] Phendimetrazine Tartrate 105 MG CP24 Take 1 capsule (105 mg total) by mouth daily.  . [DISCONTINUED] valACYclovir (VALTREX)  1000 MG tablet Take 1 tablet (1,000 mg total) by mouth daily.   No facility-administered encounter medications on file as of 09/13/2020.    Surgical History: Past Surgical History:  Procedure Laterality Date  . CHOLECYSTECTOMY      Medical History: Past Medical History:  Diagnosis Date  . Hypothyroidism     Family History: Family History  Problem Relation Age of Onset  . Anemia Mother   . Brain cancer Father   . Prostate cancer Brother   . Prostate  cancer Maternal Uncle   . Stroke Maternal Grandfather   . Breast cancer Paternal Grandmother   . Skin cancer Paternal Grandfather     Social History   Socioeconomic History  . Marital status: Married    Spouse name: Not on file  . Number of children: Not on file  . Years of education: Not on file  . Highest education level: Not on file  Occupational History  . Not on file  Tobacco Use  . Smoking status: Former Smoker    Types: Cigarettes  . Smokeless tobacco: Never Used  Substance and Sexual Activity  . Alcohol use: No  . Drug use: No  . Sexual activity: Not on file  Other Topics Concern  . Not on file  Social History Narrative  . Not on file   Social Determinants of Health   Financial Resource Strain: Not on file  Food Insecurity: Not on file  Transportation Needs: Not on file  Physical Activity: Not on file  Stress: Not on file  Social Connections: Not on file  Intimate Partner Violence: Not on file      Review of Systems  Constitutional: Positive for fatigue. Negative for activity change, chills and unexpected weight change.  HENT: Negative for congestion, postnasal drip, rhinorrhea, sneezing and sore throat.   Respiratory: Negative for cough, chest tightness, shortness of breath and wheezing.   Cardiovascular: Negative for chest pain and palpitations.       Intermittent chest discomfort   Gastrointestinal: Negative for abdominal pain, constipation, diarrhea, nausea and vomiting.  Endocrine: Negative for cold intolerance, heat intolerance, polydipsia and polyuria.       Hypothyroid.  Blood sugars doing well   Musculoskeletal: Positive for arthralgias and myalgias. Negative for back pain, joint swelling and neck pain.       Bilateral knee pain.  Low back pain.   Skin: Negative for rash.  Allergic/Immunologic: Positive for environmental allergies.  Neurological: Negative for dizziness, tremors, numbness and headaches.  Hematological: Negative for adenopathy.  Does not bruise/bleed easily.  Psychiatric/Behavioral: Negative for behavioral problems (Depression), sleep disturbance and suicidal ideas. The patient is nervous/anxious.     Today's Vitals   09/13/20 1351  BP: 137/70  Pulse: 65  Resp: 16  Temp: 98.1 F (36.7 C)  SpO2: 96%  Weight: 232 lb 9.6 oz (105.5 kg)  Height: 5\' 7"  (1.702 m)   Body mass index is 36.43 kg/m.  Physical Exam Vitals and nursing note reviewed.  Constitutional:      General: She is not in acute distress.    Appearance: Normal appearance. She is well-developed and well-nourished. She is obese. She is not diaphoretic.  HENT:     Head: Normocephalic and atraumatic.     Nose: Nose normal.     Mouth/Throat:     Mouth: Oropharynx is clear and moist.     Pharynx: No oropharyngeal exudate.  Eyes:     Extraocular Movements: EOM normal.     Pupils: Pupils  are equal, round, and reactive to light.  Neck:     Thyroid: No thyromegaly.     Vascular: No carotid bruit or JVD.     Trachea: No tracheal deviation.  Cardiovascular:     Rate and Rhythm: Normal rate and regular rhythm.     Heart sounds: Normal heart sounds. No murmur heard. No friction rub. No gallop.      Comments: ECG is within normal limits today. Pulmonary:     Effort: Pulmonary effort is normal. No respiratory distress.     Breath sounds: Normal breath sounds. No wheezing or rales.  Chest:     Chest wall: No tenderness.  Abdominal:     General: Bowel sounds are normal.     Palpations: Abdomen is soft.     Tenderness: There is no abdominal tenderness.  Musculoskeletal:        General: Normal range of motion.     Cervical back: Normal range of motion and neck supple.     Comments: Bilateral knee pain. Currently wearing knee braces on both legs. Flexion and extension are difficult due to pain. Patient using upper body to lift her self out of seated position to stand. Grimacing noted.   Lymphadenopathy:     Cervical: No cervical adenopathy.   Skin:    General: Skin is warm and dry.  Neurological:     General: No focal deficit present.     Mental Status: She is alert and oriented to person, place, and time.     Cranial Nerves: No cranial nerve deficit.  Psychiatric:        Mood and Affect: Mood and affect and mood normal.        Behavior: Behavior normal.        Thought Content: Thought content normal.        Judgment: Judgment normal.     Assessment/Plan: 1. Uncontrolled type 2 diabetes mellitus with hyperglycemia (HCC) - POCT HgB A1C 5.3 today. Continue metformin as prescribed. Monitor closely.  - POCT UA - Microalbumin - metFORMIN (GLUCOPHAGE) 500 MG tablet; Take 1 tablet (500 mg total) by mouth 2 (two) times daily with a meal.  Dispense: 180 tablet; Refill: 1  2. Low back pain with right-sided sciatica, unspecified back pain laterality, unspecified chronicity Continue meloxicam  daily to reduce pain and inflammation. Continue gabapentin  up to three times daily.  - meloxicam (MOBIC) 15 MG tablet; Take 1 tablet (15 mg total) by mouth daily.  Dispense: 90 tablet; Refill: 1 - gabapentin (NEURONTIN) 100 MG capsule; Take 1 capsule (100 mg total) by mouth 3 (three) times daily.  Dispense: 270 capsule; Refill: 1  3. Acquired hypothyroidism Check thyroid panel and adjust levothyroxine dosing as indicated.  - levothyroxine (SYNTHROID) 125 MCG tablet; Take 1 tablet (125 mcg total) by mouth every morning.  Dispense: 90 tablet; Refill: 1  4. Gastroesophageal reflux disease without esophagitis Continue pantoprazole as prescribed.  - pantoprazole (PROTONIX) 40 MG tablet; Take 1 tablet (40 mg total) by mouth daily.  Dispense: 90 tablet; Refill: 1  5. Herpes simplex infection contineu valtrex to prevent outbreak of fever blister.  - valACYclovir (VALTREX) 1000 MG tablet; Take 1 tablet (1,000 mg total) by mouth daily.  Dispense: 90 tablet; Refill: 1  6. Generalized anxiety disorder contineu to take duloxetine   daily. Refills provided today.  - DULoxetine (CYMBALTA) 20 MG capsule; Take 1 capsule (20 mg total) by mouth daily.  Dispense: 90 capsule; Refill: 1  7. Low back pain  at multiple sites May take flexeril 10mg  up to twice daily when needed.  - cyclobenzaprine (FLEXERIL) 10 MG tablet; TAKE 1 TABLET BY MOUTH TWICE DAILY AS NEEDED FOR BACK PAIN OR SPASMS  Dispense: 180 tablet; Refill: 1  8. Chronic pain of both knees Will get x-ray of both knees for further evaluation. Will refer to orthopedics as indicated.  - DG Knee 1-2 Views Left; Future - DG Knee 1-2 Views Right; Future  9. Chest pain, unspecified type ECG is within normal limits today. Suspect GERD causing chest discomfort.  - EKG 12-Lead  10. Needs flu shot Flu vaccine administered in the office today.  - Flu Vaccine MDCK QUAD PF  General Counseling: hatsumi steinhart understanding of the findings of todays visit and agrees with plan of treatment. I have discussed any further diagnostic evaluation that may be needed or ordered today. We also reviewed her medications today. she has been encouraged to call the office with any questions or concerns that should arise related to todays visit.  This patient was seen by Priscille Kluver FNP Collaboration with Dr Vincent Gros as a part of collaborative care agreement  Orders Placed This Encounter  Procedures  . DG Knee 1-2 Views Left  . DG Knee 1-2 Views Right  . Flu Vaccine MDCK QUAD PF  . POCT HgB A1C  . POCT UA - Microalbumin  . EKG 12-Lead    Meds ordered this encounter  Medications  . meloxicam (MOBIC) 15 MG tablet    Sig: Take 1 tablet (15 mg total) by mouth daily.    Dispense:  90 tablet    Refill:  1    Please run with GoodRx program. Patient given goodrx card today.    Order Specific Question:   Supervising Provider    Answer:   Lyndon Code [[id number]]  . gabapentin (NEURONTIN) 100 MG capsule    Sig: Take 1 capsule (100 mg total) by mouth 3 (three) times daily.    Dispense:   270 capsule    Refill:  1    Order Specific Question:   Supervising Provider    Answer:   Lyndon Code [[id number]]  . levothyroxine (SYNTHROID) 125 MCG tablet    Sig: Take 1 tablet (125 mcg total) by mouth every morning.    Dispense:  90 tablet    Refill:  1    Please note small increase on dosing.    Order Specific Question:   Supervising Provider    Answer:   Lyndon Code [[id number]]  . metFORMIN (GLUCOPHAGE) 500 MG tablet    Sig: Take 1 tablet (500 mg total) by mouth 2 (two) times daily with a meal.    Dispense:  180 tablet    Refill:  1    Please run with GoodRx card. Patient given GoodRx card today.    Order Specific Question:   Supervising Provider    Answer:   Lyndon Code [[id number]]  . pantoprazole (PROTONIX) 40 MG tablet    Sig: Take 1 tablet (40 mg total) by mouth daily.    Dispense:  90 tablet    Refill:  1    Order Specific Question:   Supervising Provider    Answer:   Lyndon Code [[id number]]  . valACYclovir (VALTREX) 1000 MG tablet    Sig: Take 1 tablet (1,000 mg total) by mouth daily.    Dispense:  90 tablet    Refill:  1    Suppressive therapy.  Order Specific Question:   Supervising Provider    Answer:   Lyndon Code [[id number]]  . DULoxetine (CYMBALTA) 20 MG capsule    Sig: Take 1 capsule (20 mg total) by mouth daily.    Dispense:  90 capsule    Refill:  1    Order Specific Question:   Supervising Provider    Answer:   Lyndon Code [[id number]]  . cyclobenzaprine (FLEXERIL) 10 MG tablet    Sig: TAKE 1 TABLET BY MOUTH TWICE DAILY AS NEEDED FOR BACK PAIN OR SPASMS    Dispense:  180 tablet    Refill:  1    Order Specific Question:   Supervising Provider    Answer:   Lyndon Code [[id number]]    Total time spent: 60 Minutes   Time spent includes review of chart, medications, test results, and follow up plan with the patient.      Dr Lyndon Code Internal medicine

## 2020-09-29 ENCOUNTER — Other Ambulatory Visit: Payer: Self-pay

## 2020-09-29 DIAGNOSIS — D510 Vitamin B12 deficiency anemia due to intrinsic factor deficiency: Secondary | ICD-10-CM | POA: Diagnosis not present

## 2020-09-29 DIAGNOSIS — D509 Iron deficiency anemia, unspecified: Secondary | ICD-10-CM | POA: Diagnosis not present

## 2020-09-29 DIAGNOSIS — I10 Essential (primary) hypertension: Secondary | ICD-10-CM | POA: Diagnosis not present

## 2020-09-29 DIAGNOSIS — Z0000 Encounter for general adult medical examination without abnormal findings: Secondary | ICD-10-CM | POA: Diagnosis not present

## 2020-09-30 LAB — CBC
Hematocrit: 36.4 % (ref 34.0–46.6)
Hemoglobin: 12.9 g/dL (ref 11.1–15.9)
MCH: 33.5 pg — ABNORMAL HIGH (ref 26.6–33.0)
MCHC: 35.4 g/dL (ref 31.5–35.7)
MCV: 95 fL (ref 79–97)
Platelets: 200 10*3/uL (ref 150–450)
RBC: 3.85 x10E6/uL (ref 3.77–5.28)
RDW: 13.2 % (ref 11.7–15.4)
WBC: 3.3 10*3/uL — ABNORMAL LOW (ref 3.4–10.8)

## 2020-09-30 LAB — COMPREHENSIVE METABOLIC PANEL
ALT: 15 IU/L (ref 0–32)
AST: 19 IU/L (ref 0–40)
Albumin/Globulin Ratio: 1.5 (ref 1.2–2.2)
Albumin: 4.2 g/dL (ref 3.8–4.8)
Alkaline Phosphatase: 97 IU/L (ref 44–121)
BUN/Creatinine Ratio: 18 (ref 12–28)
BUN: 15 mg/dL (ref 8–27)
Bilirubin Total: 0.5 mg/dL (ref 0.0–1.2)
CO2: 27 mmol/L (ref 20–29)
Calcium: 9.4 mg/dL (ref 8.7–10.3)
Chloride: 105 mmol/L (ref 96–106)
Creatinine, Ser: 0.84 mg/dL (ref 0.57–1.00)
GFR calc Af Amer: 86 mL/min/{1.73_m2} (ref >59)
GFR calc non Af Amer: 75 mL/min/{1.73_m2} (ref >59)
Globulin, Total: 2.8 g/dL (ref 1.5–4.5)
Glucose: 91 mg/dL (ref 65–99)
Potassium: 4.8 mmol/L (ref 3.5–5.2)
Sodium: 141 mmol/L (ref 134–144)
Total Protein: 7 g/dL (ref 6.0–8.5)

## 2020-09-30 LAB — LIPID PANEL WITH LDL/HDL RATIO
Cholesterol, Total: 173 mg/dL (ref 100–199)
HDL: 59 mg/dL (ref >39)
LDL Chol Calc (NIH): 101 mg/dL — ABNORMAL HIGH (ref 0–99)
LDL/HDL Ratio: 1.7 ratio (ref 0.0–3.2)
Triglycerides: 67 mg/dL (ref 0–149)
VLDL Cholesterol Cal: 13 mg/dL (ref 5–40)

## 2020-09-30 LAB — IRON AND TIBC
Iron Saturation: 24 % (ref 15–55)
Iron: 65 ug/dL (ref 27–139)
Total Iron Binding Capacity: 273 ug/dL (ref 250–450)
UIBC: 208 ug/dL (ref 118–369)

## 2020-09-30 LAB — VITAMIN D 25 HYDROXY (VIT D DEFICIENCY, FRACTURES): Vit D, 25-Hydroxy: 21.2 ng/mL — ABNORMAL LOW (ref 30.0–100.0)

## 2020-09-30 LAB — B12 AND FOLATE PANEL
Folate: 11.5 ng/mL (ref >3.0)
Vitamin B-12: 566 pg/mL (ref 232–1245)

## 2020-09-30 LAB — FERRITIN: Ferritin: 196 ng/mL — ABNORMAL HIGH (ref 15–150)

## 2020-09-30 LAB — T4, FREE: Free T4: 1.62 ng/dL (ref 0.82–1.77)

## 2020-09-30 LAB — TSH: TSH: 0.75 u[IU]/mL (ref 0.450–4.500)

## 2020-10-06 ENCOUNTER — Telehealth: Payer: Self-pay

## 2020-10-07 ENCOUNTER — Other Ambulatory Visit: Payer: Self-pay | Admitting: Nurse Practitioner

## 2020-10-07 ENCOUNTER — Other Ambulatory Visit: Payer: Self-pay

## 2020-10-07 DIAGNOSIS — E559 Vitamin D deficiency, unspecified: Secondary | ICD-10-CM

## 2020-10-07 MED ORDER — VITAMIN D (ERGOCALCIFEROL) 1.25 MG (50000 UNIT) PO CAPS: 50000.0000 [IU] | ORAL_CAPSULE | ORAL | 5 refills | Status: AC

## 2020-10-07 NOTE — Telephone Encounter (Signed)
Looked at and reviewed her labs. She does have moderate vitamin d deficiency. I have added rx for drisdol 50000iu to take weekly for next several months. Her feritin level is elevated, but stable from prior checks, even a little better from last check. Will continue to monitor. Her wbc was very slightly low. Likely getting over cold or infection of some sort. We can have this checked prior to next visit. Her other labs were good. X-ray of her right knee shows moderate to advanced degenerative arthritis and the left knee shows advanced degenerative arthritis. She needs to see orthopedics for further evaluation and treatment.

## 2020-10-07 NOTE — Telephone Encounter (Signed)
Spoke with pt that she said don't have any symptoms for cold when she had blood work done and also she referral for orthopedics

## 2020-10-07 NOTE — Progress Notes (Signed)
Moderate to advanced degenerative arthritis. Will need to see orthopedics.

## 2020-10-07 NOTE — Telephone Encounter (Signed)
Right. I would like to retest her CBC. I can send her lab slip or I can put orders in for [facility]. I can do referral for orthopedics, but unsure if she wants to be seen here or in [place]?

## 2020-10-07 NOTE — Telephone Encounter (Signed)
Pt notified that we mailed her labs slip and she going to call back with orthopedics name for refferal

## 2020-10-07 NOTE — Progress Notes (Signed)
Advanced degenerative disease in left knee. Will need to see orthopedics.

## 2020-10-14 ENCOUNTER — Telehealth: Payer: Self-pay

## 2020-10-14 NOTE — Telephone Encounter (Signed)
Completed medical record request and mailed requested records to [address] [place], [place] [address]. 

## 2020-10-19 ENCOUNTER — Ambulatory Visit: Payer: Self-pay | Admitting: Adult Health

## 2020-10-19 ENCOUNTER — Ambulatory Visit: Payer: Self-pay | Admitting: Hospice and Palliative Medicine

## 2020-11-22 DIAGNOSIS — E039 Hypothyroidism, unspecified: Secondary | ICD-10-CM | POA: Diagnosis not present

## 2020-11-22 DIAGNOSIS — D649 Anemia, unspecified: Secondary | ICD-10-CM | POA: Diagnosis not present

## 2020-11-22 DIAGNOSIS — M545 Low back pain, unspecified: Secondary | ICD-10-CM | POA: Diagnosis not present

## 2020-11-22 DIAGNOSIS — F419 Anxiety disorder, unspecified: Secondary | ICD-10-CM | POA: Diagnosis not present

## 2020-11-22 DIAGNOSIS — K219 Gastro-esophageal reflux disease without esophagitis: Secondary | ICD-10-CM | POA: Diagnosis not present

## 2020-11-22 DIAGNOSIS — M25569 Pain in unspecified knee: Secondary | ICD-10-CM | POA: Diagnosis not present

## 2020-11-22 DIAGNOSIS — G8929 Other chronic pain: Secondary | ICD-10-CM | POA: Diagnosis not present

## 2020-11-22 DIAGNOSIS — E139 Other specified diabetes mellitus without complications: Secondary | ICD-10-CM | POA: Diagnosis not present

## 2020-11-22 DIAGNOSIS — N3281 Overactive bladder: Secondary | ICD-10-CM | POA: Diagnosis not present

## 2020-11-22 DIAGNOSIS — E669 Obesity, unspecified: Secondary | ICD-10-CM | POA: Diagnosis not present

## 2020-11-22 DIAGNOSIS — R5383 Other fatigue: Secondary | ICD-10-CM | POA: Diagnosis not present

## 2021-03-19 ENCOUNTER — Other Ambulatory Visit: Payer: Self-pay | Admitting: Nurse Practitioner

## 2021-03-19 DIAGNOSIS — K219 Gastro-esophageal reflux disease without esophagitis: Secondary | ICD-10-CM

## 2021-03-19 DIAGNOSIS — M5441 Lumbago with sciatica, right side: Secondary | ICD-10-CM

## 2021-06-22 ENCOUNTER — Other Ambulatory Visit: Payer: Self-pay | Admitting: Nurse Practitioner

## 2021-06-22 DIAGNOSIS — F411 Generalized anxiety disorder: Secondary | ICD-10-CM

## 2021-06-22 DIAGNOSIS — M545 Low back pain, unspecified: Secondary | ICD-10-CM

## 2021-06-22 DIAGNOSIS — E039 Hypothyroidism, unspecified: Secondary | ICD-10-CM

## 2021-07-06 DIAGNOSIS — M25512 Pain in left shoulder: Secondary | ICD-10-CM | POA: Diagnosis not present

## 2021-07-06 DIAGNOSIS — N3281 Overactive bladder: Secondary | ICD-10-CM | POA: Diagnosis not present

## 2021-07-06 DIAGNOSIS — G8929 Other chronic pain: Secondary | ICD-10-CM | POA: Diagnosis not present

## 2021-07-06 DIAGNOSIS — E039 Hypothyroidism, unspecified: Secondary | ICD-10-CM | POA: Diagnosis not present

## 2021-08-05 DIAGNOSIS — E669 Obesity, unspecified: Secondary | ICD-10-CM | POA: Diagnosis not present

## 2021-08-05 DIAGNOSIS — F419 Anxiety disorder, unspecified: Secondary | ICD-10-CM | POA: Diagnosis not present

## 2021-08-05 DIAGNOSIS — M545 Low back pain, unspecified: Secondary | ICD-10-CM | POA: Diagnosis not present

## 2021-08-05 DIAGNOSIS — Z6837 Body mass index (BMI) 37.0-37.9, adult: Secondary | ICD-10-CM | POA: Diagnosis not present

## 2021-08-05 DIAGNOSIS — E039 Hypothyroidism, unspecified: Secondary | ICD-10-CM | POA: Diagnosis not present

## 2021-08-05 DIAGNOSIS — K219 Gastro-esophageal reflux disease without esophagitis: Secondary | ICD-10-CM | POA: Diagnosis not present

## 2021-09-28 ENCOUNTER — Other Ambulatory Visit: Payer: Self-pay | Admitting: Nurse Practitioner

## 2021-09-28 DIAGNOSIS — M5441 Lumbago with sciatica, right side: Secondary | ICD-10-CM

## 2021-10-14 ENCOUNTER — Other Ambulatory Visit: Payer: Self-pay | Admitting: Nurse Practitioner

## 2021-10-14 DIAGNOSIS — E119 Type 2 diabetes mellitus without complications: Secondary | ICD-10-CM

## 2021-10-14 DIAGNOSIS — M5441 Lumbago with sciatica, right side: Secondary | ICD-10-CM

## 2021-10-14 DIAGNOSIS — M545 Low back pain, unspecified: Secondary | ICD-10-CM

## 2022-01-02 ENCOUNTER — Other Ambulatory Visit: Payer: Self-pay | Admitting: Nurse Practitioner

## 2022-01-02 DIAGNOSIS — M545 Low back pain, unspecified: Secondary | ICD-10-CM

## 2022-01-02 DIAGNOSIS — M5441 Lumbago with sciatica, right side: Secondary | ICD-10-CM

## 2022-02-03 ENCOUNTER — Other Ambulatory Visit: Payer: Self-pay | Admitting: Nurse Practitioner

## 2022-02-03 DIAGNOSIS — M545 Low back pain, unspecified: Secondary | ICD-10-CM

## 2022-04-14 ENCOUNTER — Other Ambulatory Visit: Payer: Self-pay | Admitting: Nurse Practitioner

## 2022-04-14 DIAGNOSIS — M5441 Lumbago with sciatica, right side: Secondary | ICD-10-CM
